# Patient Record
Sex: Female | Born: 1955 | Race: White | Hispanic: No | Marital: Married | Smoking: Never smoker
Health system: Southern US, Community
[De-identification: ages and names within clinical notes are randomized; demographics above are authoritative.]

## PROBLEM LIST (undated history)

## (undated) DIAGNOSIS — I712 Thoracic aortic aneurysm, without rupture, unspecified: Secondary | ICD-10-CM

## (undated) DIAGNOSIS — E785 Hyperlipidemia, unspecified: Secondary | ICD-10-CM

## (undated) HISTORY — PX: OTHER SURGICAL HISTORY: SHX169

---

## 2016-03-17 ENCOUNTER — Other Ambulatory Visit: Payer: Self-pay | Admitting: Family Medicine

## 2016-03-17 DIAGNOSIS — I712 Thoracic aortic aneurysm, without rupture, unspecified: Secondary | ICD-10-CM

## 2016-03-17 DIAGNOSIS — G43909 Migraine, unspecified, not intractable, without status migrainosus: Secondary | ICD-10-CM | POA: Diagnosis not present

## 2016-03-17 DIAGNOSIS — M859 Disorder of bone density and structure, unspecified: Secondary | ICD-10-CM | POA: Diagnosis not present

## 2016-03-17 DIAGNOSIS — E7800 Pure hypercholesterolemia, unspecified: Secondary | ICD-10-CM | POA: Diagnosis not present

## 2016-03-17 DIAGNOSIS — Z209 Contact with and (suspected) exposure to unspecified communicable disease: Secondary | ICD-10-CM | POA: Diagnosis not present

## 2016-03-17 DIAGNOSIS — Z0000 Encounter for general adult medical examination without abnormal findings: Secondary | ICD-10-CM | POA: Diagnosis not present

## 2016-03-17 DIAGNOSIS — Z79899 Other long term (current) drug therapy: Secondary | ICD-10-CM | POA: Diagnosis not present

## 2016-03-23 ENCOUNTER — Ambulatory Visit
Admission: RE | Admit: 2016-03-23 | Discharge: 2016-03-23 | Disposition: A | Discharge status: Home / Self Care | Payer: Self-pay | Source: Ambulatory Visit | Attending: Family Medicine | Admitting: Family Medicine

## 2016-03-23 DIAGNOSIS — I712 Thoracic aortic aneurysm, without rupture, unspecified: Secondary | ICD-10-CM

## 2016-03-23 MED ORDER — IOPAMIDOL (ISOVUE-370) INJECTION 76%
75.0000 mL | Freq: Once | INTRAVENOUS | Status: AC | PRN
  Administered 2016-03-23: 75 mL via INTRAVENOUS

## 2016-06-30 DIAGNOSIS — K080 Exfoliation of teeth due to systemic causes: Secondary | ICD-10-CM | POA: Diagnosis not present

## 2016-07-21 ENCOUNTER — Other Ambulatory Visit: Payer: Self-pay | Admitting: Family Medicine

## 2016-07-21 DIAGNOSIS — I712 Thoracic aortic aneurysm, without rupture, unspecified: Secondary | ICD-10-CM

## 2016-07-21 DIAGNOSIS — M79604 Pain in right leg: Secondary | ICD-10-CM | POA: Diagnosis not present

## 2016-07-21 DIAGNOSIS — E7800 Pure hypercholesterolemia, unspecified: Secondary | ICD-10-CM | POA: Diagnosis not present

## 2016-07-21 DIAGNOSIS — G43909 Migraine, unspecified, not intractable, without status migrainosus: Secondary | ICD-10-CM | POA: Diagnosis not present

## 2016-09-13 DIAGNOSIS — Z6825 Body mass index (BMI) 25.0-25.9, adult: Secondary | ICD-10-CM | POA: Diagnosis not present

## 2016-09-13 DIAGNOSIS — Z1231 Encounter for screening mammogram for malignant neoplasm of breast: Secondary | ICD-10-CM | POA: Diagnosis not present

## 2016-09-13 DIAGNOSIS — R5383 Other fatigue: Secondary | ICD-10-CM | POA: Diagnosis not present

## 2016-09-13 DIAGNOSIS — Z130 Encounter for screening for diseases of the blood and blood-forming organs and certain disorders involving the immune mechanism: Secondary | ICD-10-CM | POA: Diagnosis not present

## 2016-09-13 DIAGNOSIS — Z1389 Encounter for screening for other disorder: Secondary | ICD-10-CM | POA: Diagnosis not present

## 2016-09-13 DIAGNOSIS — Z01419 Encounter for gynecological examination (general) (routine) without abnormal findings: Secondary | ICD-10-CM | POA: Diagnosis not present

## 2016-09-15 ENCOUNTER — Other Ambulatory Visit: Payer: Self-pay | Admitting: Obstetrics and Gynecology

## 2016-09-15 DIAGNOSIS — R928 Other abnormal and inconclusive findings on diagnostic imaging of breast: Secondary | ICD-10-CM

## 2016-09-17 ENCOUNTER — Ambulatory Visit
Admission: RE | Admit: 2016-09-17 | Discharge: 2016-09-17 | Disposition: A | Discharge status: Home / Self Care | Payer: Federal, State, Local not specified - PPO | Source: Ambulatory Visit | Attending: Obstetrics and Gynecology | Admitting: Obstetrics and Gynecology

## 2016-09-17 DIAGNOSIS — R928 Other abnormal and inconclusive findings on diagnostic imaging of breast: Secondary | ICD-10-CM

## 2016-09-17 DIAGNOSIS — N6313 Unspecified lump in the right breast, lower outer quadrant: Secondary | ICD-10-CM | POA: Diagnosis not present

## 2016-09-29 ENCOUNTER — Ambulatory Visit
Admission: RE | Admit: 2016-09-29 | Discharge: 2016-09-29 | Disposition: A | Discharge status: Home / Self Care | Payer: Federal, State, Local not specified - PPO | Source: Ambulatory Visit | Attending: Family Medicine | Admitting: Family Medicine

## 2016-09-29 DIAGNOSIS — I712 Thoracic aortic aneurysm, without rupture, unspecified: Secondary | ICD-10-CM

## 2016-09-29 MED ORDER — IOPAMIDOL (ISOVUE-370) INJECTION 76%
75.0000 mL | Freq: Once | INTRAVENOUS | Status: AC | PRN
  Administered 2016-09-29: 75 mL via INTRAVENOUS

## 2016-10-06 DIAGNOSIS — Q181 Preauricular sinus and cyst: Secondary | ICD-10-CM | POA: Diagnosis not present

## 2016-10-19 DIAGNOSIS — L723 Sebaceous cyst: Secondary | ICD-10-CM | POA: Diagnosis not present

## 2016-10-19 DIAGNOSIS — L989 Disorder of the skin and subcutaneous tissue, unspecified: Secondary | ICD-10-CM | POA: Diagnosis not present

## 2016-11-30 DIAGNOSIS — L989 Disorder of the skin and subcutaneous tissue, unspecified: Secondary | ICD-10-CM | POA: Diagnosis not present

## 2016-11-30 DIAGNOSIS — D210 Benign neoplasm of connective and other soft tissue of head, face and neck: Secondary | ICD-10-CM | POA: Diagnosis not present

## 2016-12-20 DIAGNOSIS — H938X2 Other specified disorders of left ear: Secondary | ICD-10-CM | POA: Diagnosis not present

## 2016-12-20 DIAGNOSIS — M724 Pseudosarcomatous fibromatosis: Secondary | ICD-10-CM | POA: Diagnosis not present

## 2017-01-05 DIAGNOSIS — K080 Exfoliation of teeth due to systemic causes: Secondary | ICD-10-CM | POA: Diagnosis not present

## 2017-02-03 IMAGING — CT CT ANGIO CHEST
2 of 5 series · 8 of 30 positions shown · IV contrast (75CC ISOVUE 370)
Comparison: None available; prior exam was in [place] in 4204.

CLINICAL DATA: Thoracic aortic aneurysm without rupture, followup

EXAM:
CT ANGIOGRAPHY CHEST WITH CONTRAST
TECHNIQUE: Multidetector CT imaging of the chest was performed using the
standard protocol during bolus administration of intravenous
contrast. Multiplanar CT image reconstructions and MIPs were
obtained to evaluate the vascular anatomy.
CONTRAST:  75 cc Isovue 370 IV

[Series 4: angio · axial · 0.70mm/px · z∈[-205,-55]mm · 3 of 120 slices shown]
[im 30/120  lung]
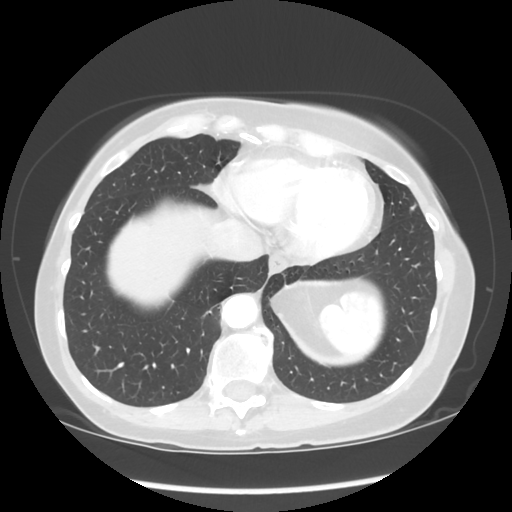
[im 60/120  mediastinal]
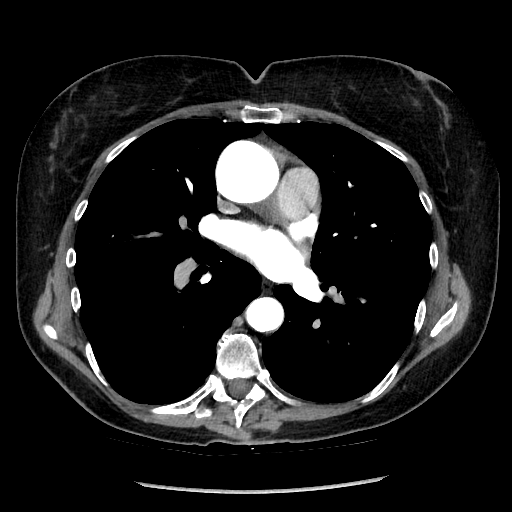
[im 90/120  lung]
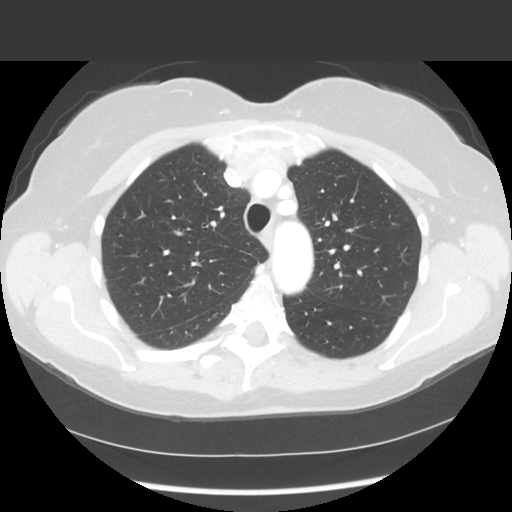

[Series 602: sagittal body · sagittal · 0.70mm/px · 5 of 145 slices shown]
[im 25/145  lung]
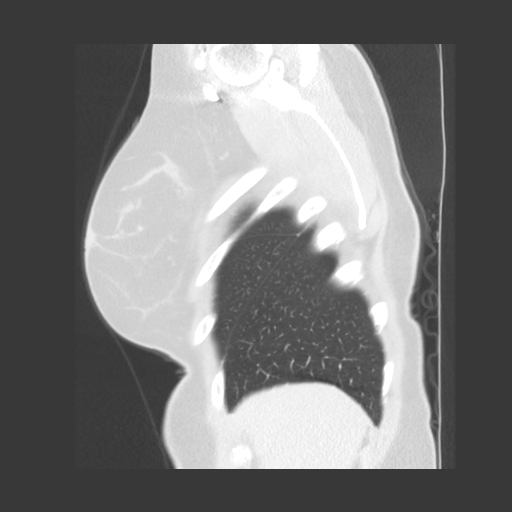
[im 49/145  lung]
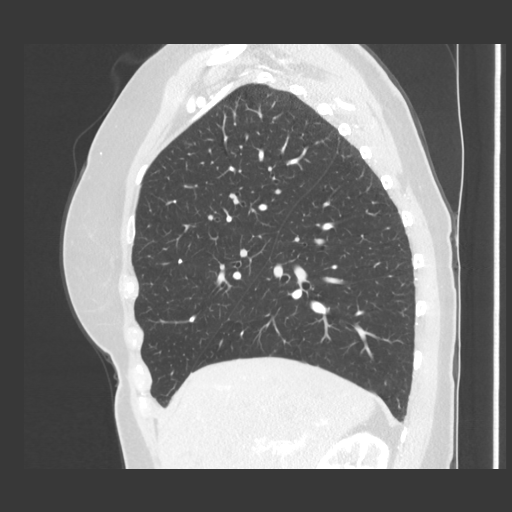
[im 73/145  lung]
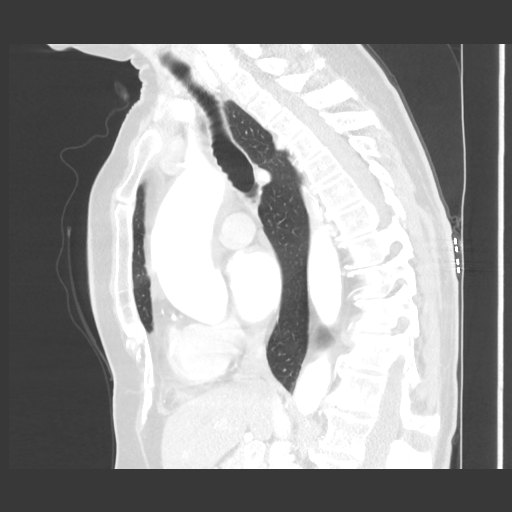
[im 97/145  lung]
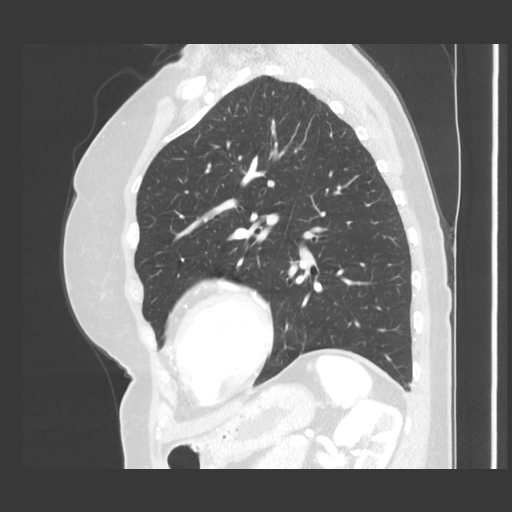
[im 121/145  lung]
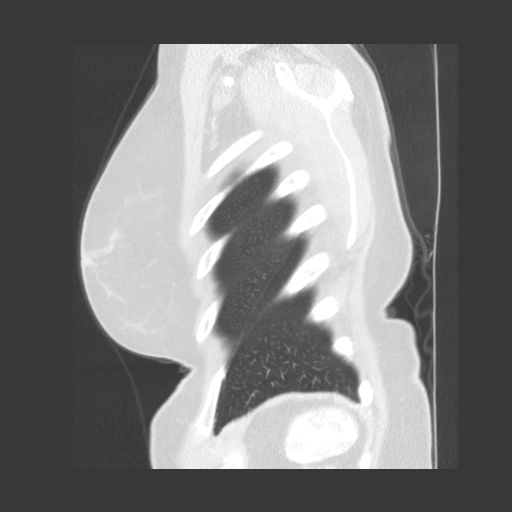

[8 of 30 positions shown; findings below may reference images not displayed]

FINDINGS: Minimal atherosclerotic calcification aorta.

Aneurysmal dilatation ascending thoracic aorta, 4.6 cm transverse at
the level of the RIGHT pulmonary artery and 4.2 cm transverse at
aortic root.

Mild LEFT ventricular dilatation.

Pulmonary arteries grossly unremarkable for aortic technique.

No thoracic adenopathy.

Multiple hepatic cysts.

Remaining visualized upper abdomen unremarkable.

Minimal atelectasis or scarring at medial RIGHT lower lobe.

Small linear scar posterior RIGHT apex image 17.

Lungs otherwise clear.

No infiltrate, pleural effusion or pneumothorax.

No acute osseous findings.

Review of the MIP images confirms the above findings.
IMPRESSION: Aneurysmal dilatation ascending thoracic aorta to a maximal though
4.6 cm transverse at the level of the RIGHT pulmonary artery.

Associated LEFT ventricular dilatation.

Remainder of exam shows no significant abnormalities.

## 2017-03-07 DIAGNOSIS — J302 Other seasonal allergic rhinitis: Secondary | ICD-10-CM | POA: Diagnosis not present

## 2017-03-07 DIAGNOSIS — R432 Parageusia: Secondary | ICD-10-CM | POA: Diagnosis not present

## 2017-03-07 DIAGNOSIS — E7800 Pure hypercholesterolemia, unspecified: Secondary | ICD-10-CM | POA: Diagnosis not present

## 2017-03-07 DIAGNOSIS — R8290 Unspecified abnormal findings in urine: Secondary | ICD-10-CM | POA: Diagnosis not present

## 2017-03-23 DIAGNOSIS — E7800 Pure hypercholesterolemia, unspecified: Secondary | ICD-10-CM | POA: Diagnosis not present

## 2017-03-23 DIAGNOSIS — G43909 Migraine, unspecified, not intractable, without status migrainosus: Secondary | ICD-10-CM | POA: Diagnosis not present

## 2017-03-23 DIAGNOSIS — D72819 Decreased white blood cell count, unspecified: Secondary | ICD-10-CM | POA: Diagnosis not present

## 2017-03-23 DIAGNOSIS — I712 Thoracic aortic aneurysm, without rupture: Secondary | ICD-10-CM | POA: Diagnosis not present

## 2017-03-23 DIAGNOSIS — M85852 Other specified disorders of bone density and structure, left thigh: Secondary | ICD-10-CM | POA: Diagnosis not present

## 2017-03-23 DIAGNOSIS — M859 Disorder of bone density and structure, unspecified: Secondary | ICD-10-CM | POA: Diagnosis not present

## 2017-03-23 DIAGNOSIS — Z0000 Encounter for general adult medical examination without abnormal findings: Secondary | ICD-10-CM | POA: Diagnosis not present

## 2017-07-26 DIAGNOSIS — K080 Exfoliation of teeth due to systemic causes: Secondary | ICD-10-CM | POA: Diagnosis not present

## 2017-09-15 DIAGNOSIS — N951 Menopausal and female climacteric states: Secondary | ICD-10-CM | POA: Diagnosis not present

## 2017-09-15 DIAGNOSIS — Z130 Encounter for screening for diseases of the blood and blood-forming organs and certain disorders involving the immune mechanism: Secondary | ICD-10-CM | POA: Diagnosis not present

## 2017-09-15 DIAGNOSIS — R319 Hematuria, unspecified: Secondary | ICD-10-CM | POA: Diagnosis not present

## 2017-09-15 DIAGNOSIS — Z1389 Encounter for screening for other disorder: Secondary | ICD-10-CM | POA: Diagnosis not present

## 2017-09-15 DIAGNOSIS — Z01419 Encounter for gynecological examination (general) (routine) without abnormal findings: Secondary | ICD-10-CM | POA: Diagnosis not present

## 2017-09-15 DIAGNOSIS — Z1231 Encounter for screening mammogram for malignant neoplasm of breast: Secondary | ICD-10-CM | POA: Diagnosis not present

## 2017-09-15 DIAGNOSIS — Z6824 Body mass index (BMI) 24.0-24.9, adult: Secondary | ICD-10-CM | POA: Diagnosis not present

## 2017-09-26 DIAGNOSIS — J018 Other acute sinusitis: Secondary | ICD-10-CM | POA: Diagnosis not present

## 2017-09-26 DIAGNOSIS — R0602 Shortness of breath: Secondary | ICD-10-CM | POA: Diagnosis not present

## 2017-12-20 DIAGNOSIS — M79604 Pain in right leg: Secondary | ICD-10-CM | POA: Diagnosis not present

## 2017-12-20 DIAGNOSIS — R432 Parageusia: Secondary | ICD-10-CM | POA: Diagnosis not present

## 2017-12-20 DIAGNOSIS — E7800 Pure hypercholesterolemia, unspecified: Secondary | ICD-10-CM | POA: Diagnosis not present

## 2017-12-20 DIAGNOSIS — R05 Cough: Secondary | ICD-10-CM | POA: Diagnosis not present

## 2017-12-21 DIAGNOSIS — J018 Other acute sinusitis: Secondary | ICD-10-CM | POA: Diagnosis not present

## 2017-12-21 DIAGNOSIS — G43909 Migraine, unspecified, not intractable, without status migrainosus: Secondary | ICD-10-CM | POA: Diagnosis not present

## 2018-01-04 DIAGNOSIS — J342 Deviated nasal septum: Secondary | ICD-10-CM | POA: Diagnosis not present

## 2018-01-04 DIAGNOSIS — J322 Chronic ethmoidal sinusitis: Secondary | ICD-10-CM | POA: Diagnosis not present

## 2018-01-04 DIAGNOSIS — B370 Candidal stomatitis: Secondary | ICD-10-CM | POA: Diagnosis not present

## 2018-01-04 DIAGNOSIS — J320 Chronic maxillary sinusitis: Secondary | ICD-10-CM | POA: Diagnosis not present

## 2018-01-17 DIAGNOSIS — R432 Parageusia: Secondary | ICD-10-CM | POA: Diagnosis not present

## 2018-01-17 DIAGNOSIS — J342 Deviated nasal septum: Secondary | ICD-10-CM | POA: Diagnosis not present

## 2018-01-17 DIAGNOSIS — J322 Chronic ethmoidal sinusitis: Secondary | ICD-10-CM | POA: Diagnosis not present

## 2018-01-17 DIAGNOSIS — J320 Chronic maxillary sinusitis: Secondary | ICD-10-CM | POA: Diagnosis not present

## 2018-02-01 DIAGNOSIS — K080 Exfoliation of teeth due to systemic causes: Secondary | ICD-10-CM | POA: Diagnosis not present

## 2018-05-12 DIAGNOSIS — I8390 Asymptomatic varicose veins of unspecified lower extremity: Secondary | ICD-10-CM | POA: Diagnosis not present

## 2018-06-21 DIAGNOSIS — E7800 Pure hypercholesterolemia, unspecified: Secondary | ICD-10-CM | POA: Diagnosis not present

## 2018-06-21 DIAGNOSIS — M791 Myalgia, unspecified site: Secondary | ICD-10-CM | POA: Diagnosis not present

## 2018-06-21 DIAGNOSIS — G43909 Migraine, unspecified, not intractable, without status migrainosus: Secondary | ICD-10-CM | POA: Diagnosis not present

## 2018-06-21 DIAGNOSIS — M859 Disorder of bone density and structure, unspecified: Secondary | ICD-10-CM | POA: Diagnosis not present

## 2018-06-21 DIAGNOSIS — I712 Thoracic aortic aneurysm, without rupture: Secondary | ICD-10-CM | POA: Diagnosis not present

## 2018-08-09 DIAGNOSIS — K080 Exfoliation of teeth due to systemic causes: Secondary | ICD-10-CM | POA: Diagnosis not present

## 2018-08-15 ENCOUNTER — Other Ambulatory Visit: Payer: Self-pay | Admitting: Family Medicine

## 2018-08-15 DIAGNOSIS — I712 Thoracic aortic aneurysm, without rupture, unspecified: Secondary | ICD-10-CM

## 2018-08-22 ENCOUNTER — Ambulatory Visit
Admission: RE | Admit: 2018-08-22 | Discharge: 2018-08-22 | Disposition: A | Discharge status: Home / Self Care | Payer: Federal, State, Local not specified - PPO | Source: Ambulatory Visit | Attending: Family Medicine | Admitting: Family Medicine

## 2018-08-22 DIAGNOSIS — I712 Thoracic aortic aneurysm, without rupture, unspecified: Secondary | ICD-10-CM

## 2018-08-22 MED ORDER — IOPAMIDOL (ISOVUE-370) INJECTION 76%
75.0000 mL | Freq: Once | INTRAVENOUS | Status: AC | PRN
  Administered 2018-08-22: 75 mL via INTRAVENOUS

## 2018-09-18 DIAGNOSIS — Z1231 Encounter for screening mammogram for malignant neoplasm of breast: Secondary | ICD-10-CM | POA: Diagnosis not present

## 2018-11-27 DIAGNOSIS — K080 Exfoliation of teeth due to systemic causes: Secondary | ICD-10-CM | POA: Diagnosis not present

## 2019-01-04 DIAGNOSIS — K080 Exfoliation of teeth due to systemic causes: Secondary | ICD-10-CM | POA: Diagnosis not present

## 2019-02-26 DIAGNOSIS — N3000 Acute cystitis without hematuria: Secondary | ICD-10-CM | POA: Diagnosis not present

## 2019-06-22 DIAGNOSIS — E7800 Pure hypercholesterolemia, unspecified: Secondary | ICD-10-CM | POA: Diagnosis not present

## 2019-06-22 DIAGNOSIS — M859 Disorder of bone density and structure, unspecified: Secondary | ICD-10-CM | POA: Diagnosis not present

## 2019-06-22 DIAGNOSIS — E559 Vitamin D deficiency, unspecified: Secondary | ICD-10-CM | POA: Diagnosis not present

## 2019-06-22 DIAGNOSIS — G43909 Migraine, unspecified, not intractable, without status migrainosus: Secondary | ICD-10-CM | POA: Diagnosis not present

## 2019-07-10 ENCOUNTER — Other Ambulatory Visit: Payer: Self-pay | Admitting: Family Medicine

## 2019-07-10 DIAGNOSIS — I712 Thoracic aortic aneurysm, without rupture, unspecified: Secondary | ICD-10-CM

## 2019-08-01 DIAGNOSIS — E7800 Pure hypercholesterolemia, unspecified: Secondary | ICD-10-CM | POA: Diagnosis not present

## 2019-08-01 DIAGNOSIS — E559 Vitamin D deficiency, unspecified: Secondary | ICD-10-CM | POA: Diagnosis not present

## 2019-08-23 ENCOUNTER — Ambulatory Visit
Admission: RE | Admit: 2019-08-23 | Discharge: 2019-08-23 | Disposition: A | Discharge status: Home / Self Care | Payer: Federal, State, Local not specified - PPO | Source: Ambulatory Visit | Attending: Family Medicine | Admitting: Family Medicine

## 2019-08-23 ENCOUNTER — Other Ambulatory Visit: Payer: Self-pay

## 2019-08-23 DIAGNOSIS — I712 Thoracic aortic aneurysm, without rupture, unspecified: Secondary | ICD-10-CM

## 2019-08-23 DIAGNOSIS — J9811 Atelectasis: Secondary | ICD-10-CM | POA: Diagnosis not present

## 2019-08-23 MED ORDER — GADOBENATE DIMEGLUMINE 529 MG/ML IV SOLN
13.0000 mL | Freq: Once | INTRAVENOUS | Status: AC | PRN
  Administered 2019-08-23: 09:00:00 13 mL via INTRAVENOUS

## 2020-02-06 DIAGNOSIS — Z130 Encounter for screening for diseases of the blood and blood-forming organs and certain disorders involving the immune mechanism: Secondary | ICD-10-CM | POA: Diagnosis not present

## 2020-02-06 DIAGNOSIS — R8290 Unspecified abnormal findings in urine: Secondary | ICD-10-CM | POA: Diagnosis not present

## 2020-02-06 DIAGNOSIS — Z124 Encounter for screening for malignant neoplasm of cervix: Secondary | ICD-10-CM | POA: Diagnosis not present

## 2020-02-06 DIAGNOSIS — Z6825 Body mass index (BMI) 25.0-25.9, adult: Secondary | ICD-10-CM | POA: Diagnosis not present

## 2020-02-06 DIAGNOSIS — Z1151 Encounter for screening for human papillomavirus (HPV): Secondary | ICD-10-CM | POA: Diagnosis not present

## 2020-02-06 DIAGNOSIS — Z1231 Encounter for screening mammogram for malignant neoplasm of breast: Secondary | ICD-10-CM | POA: Diagnosis not present

## 2020-02-06 DIAGNOSIS — Z01419 Encounter for gynecological examination (general) (routine) without abnormal findings: Secondary | ICD-10-CM | POA: Diagnosis not present

## 2020-05-13 DIAGNOSIS — R1013 Epigastric pain: Secondary | ICD-10-CM | POA: Diagnosis not present

## 2020-05-14 ENCOUNTER — Other Ambulatory Visit (HOSPITAL_BASED_OUTPATIENT_CLINIC_OR_DEPARTMENT_OTHER): Payer: Self-pay | Admitting: Family Medicine

## 2020-05-14 DIAGNOSIS — R1013 Epigastric pain: Secondary | ICD-10-CM | POA: Diagnosis not present

## 2020-05-23 ENCOUNTER — Ambulatory Visit (HOSPITAL_BASED_OUTPATIENT_CLINIC_OR_DEPARTMENT_OTHER)
Admission: RE | Admit: 2020-05-23 | Discharge: 2020-05-23 | Disposition: A | Discharge status: Home / Self Care | Payer: Federal, State, Local not specified - PPO | Source: Ambulatory Visit | Attending: Family Medicine | Admitting: Family Medicine

## 2020-05-23 ENCOUNTER — Other Ambulatory Visit: Payer: Self-pay

## 2020-05-23 DIAGNOSIS — R1013 Epigastric pain: Secondary | ICD-10-CM | POA: Diagnosis not present

## 2020-12-29 ENCOUNTER — Other Ambulatory Visit: Payer: Self-pay | Admitting: Family Medicine

## 2020-12-29 DIAGNOSIS — I712 Thoracic aortic aneurysm, without rupture, unspecified: Secondary | ICD-10-CM

## 2020-12-29 DIAGNOSIS — I7121 Aneurysm of the ascending aorta, without rupture: Secondary | ICD-10-CM

## 2021-06-22 ENCOUNTER — Other Ambulatory Visit: Payer: Self-pay

## 2021-06-22 ENCOUNTER — Ambulatory Visit
Admission: RE | Admit: 2021-06-22 | Discharge: 2021-06-22 | Disposition: A | Discharge status: Home / Self Care | Payer: Medicare Other | Source: Ambulatory Visit | Attending: Family Medicine | Admitting: Family Medicine

## 2021-06-22 DIAGNOSIS — I712 Thoracic aortic aneurysm, without rupture, unspecified: Secondary | ICD-10-CM

## 2021-06-22 DIAGNOSIS — I7121 Aneurysm of the ascending aorta, without rupture: Secondary | ICD-10-CM

## 2021-06-22 MED ORDER — GADOBENATE DIMEGLUMINE 529 MG/ML IV SOLN
14.0000 mL | Freq: Once | INTRAVENOUS | Status: AC | PRN
  Administered 2021-06-22: 14 mL via INTRAVENOUS

## 2021-06-23 ENCOUNTER — Other Ambulatory Visit: Payer: Federal, State, Local not specified - PPO

## 2022-04-29 ENCOUNTER — Encounter: Payer: Self-pay | Admitting: Plastic Surgery

## 2022-04-29 ENCOUNTER — Ambulatory Visit (INDEPENDENT_AMBULATORY_CARE_PROVIDER_SITE_OTHER): Payer: Self-pay | Admitting: Plastic Surgery

## 2022-04-29 DIAGNOSIS — Z719 Counseling, unspecified: Secondary | ICD-10-CM

## 2022-04-29 NOTE — Progress Notes (Signed)
Botulinum Toxin Procedure Note  Procedure: Cosmetic botulinum toxin   Pre-operative Diagnosis: Dynamic rhytides   Post-operative Diagnosis: Same  Complications:  None  Brief history: The patient desires botulinum toxin injection of her forehead. I discussed with the patient this proposed procedure of botulinum toxin injections, which is customized depending on the particular needs of the patient. It is performed on facial rhytids as a temporary correction. The alternatives were discussed with the patient. The risks were addressed including bleeding, scarring, infection, damage to deeper structures, asymmetry, and chronic pain, which may occur infrequently after a procedure. The individual's choice to undergo a surgical procedure is based on the comparison of risks to potential benefits. Other risks include unsatisfactory results, brow ptosis, eyelid ptosis, allergic reaction, temporary paralysis, which should go away with time, bruising, blurring disturbances and delayed healing. Botulinum toxin injections do not arrest the aging process or produce permanent tightening of the eyelid.  Operative intervention maybe necessary to maintain the results of a blepharoplasty or botulinum toxin. The patient understands and wishes to proceed.  Procedure: The area was prepped with alcohol and dried with a clean gauze. Using a clean technique, the botulinum toxin was diluted with 1.25 cc of preservative-free normal saline which was slowly injected with an 18 gauge needle in a tuberculin syringes.  A 32 gauge needles were then used to inject the botulinum toxin. This mixture allow for an aliquot of 4 units per 0.1 cc in each injection site.    Subsequently the mixture was injected in the glabellar and forehead area with preservation of the temporal branch to the lateral eyebrow as well as into each lateral canthal area beginning from the lateral orbital rim medial to the zygomaticus major in 3 separate areas. A  total of 30 Units of botulinum toxin was used. The forehead and glabellar area was injected with care to inject intramuscular only while holding pressure on the supratrochlear vessels in each area during each injection on either side of the medial corrugators. The injection proceeded vertically superiorly to the medial 2/3 of the frontalis muscle and superior 2/3 of the lateral frontalis, again with preservation of the frontal branch. No complications were noted. Light pressure was held for 5 minutes. She was instructed explicitly in post-operative care.  Botox LOT:  [id number]  EXP:  2025/5  

## 2022-05-07 ENCOUNTER — Encounter: Payer: Self-pay | Admitting: Plastic Surgery

## 2022-05-07 ENCOUNTER — Ambulatory Visit (INDEPENDENT_AMBULATORY_CARE_PROVIDER_SITE_OTHER): Payer: Self-pay | Admitting: Plastic Surgery

## 2022-05-07 DIAGNOSIS — Z719 Counseling, unspecified: Secondary | ICD-10-CM

## 2022-05-07 NOTE — Progress Notes (Signed)
Preoperative Dx: hyperpigmentation of face  Postoperative Dx:  same  Procedure: laser to face   Anesthesia: none  Description of Procedure:  Risks and complications were explained to the patient. Consent was confirmed and signed. Eye protection was placed. Time out was called and all information was confirmed to be correct. The area  area was prepped with alcohol and wiped dry. The BBL laser was set at 590 and 515 at 6-7 J/cm2. The fae was lasered. The patient tolerated the procedure well and there were no complications. The patient is to follow up in 4 weeks.

## 2022-05-18 ENCOUNTER — Other Ambulatory Visit: Payer: Self-pay | Admitting: Family Medicine

## 2022-05-18 DIAGNOSIS — I7121 Aneurysm of the ascending aorta, without rupture: Secondary | ICD-10-CM

## 2022-05-26 NOTE — Progress Notes (Signed)
Preoperative Dx: hyperpigmentation to face  Postoperative Dx:  same  Procedure: laser to face   Anesthesia: none  Description of Procedure:  Risks and complications were explained to the patient. Consent was confirmed and signed. Time out was called and all information was confirmed to be correct. The area  area was prepped with alcohol and wiped dry. The BBL  laser was set at 590 and 515 at 5-6 J/cm2. The face was lasered.   The patient tolerated the procedure well and there were no complications. I discussed postprocedure care with the patient.The patient is to follow up in 4 weeks.

## 2022-05-28 ENCOUNTER — Ambulatory Visit (INDEPENDENT_AMBULATORY_CARE_PROVIDER_SITE_OTHER): Payer: Self-pay | Admitting: Student

## 2022-05-28 DIAGNOSIS — Z719 Counseling, unspecified: Secondary | ICD-10-CM

## 2022-06-18 ENCOUNTER — Ambulatory Visit (INDEPENDENT_AMBULATORY_CARE_PROVIDER_SITE_OTHER): Payer: Self-pay | Admitting: Student

## 2022-06-18 DIAGNOSIS — Z719 Counseling, unspecified: Secondary | ICD-10-CM

## 2022-06-18 NOTE — Progress Notes (Signed)
Preoperative Dx: Hyperpigmentation to the face  Postoperative Dx:  same  Procedure: laser to face  Anesthesia: none  Patient states she has a pigmented lesion on the left nasal sidewall that she would like treated.  She states she has had this evaluated by dermatology and biopsied, she states that is noncancerous.  Description of Procedure:  Risks and complications were explained to the patient. Consent was confirmed and signed. Time out was called and all information was confirmed to be correct. The area  area was prepped with alcohol and wiped dry. The BBL laser was set at 590 nm and 515 nm at 5 J/cm2. The the face was lasered.  The BBL laser was set at 515 nm at 12 J/cm with the 7 mm handpiece and one shot was completed to the lesion to the left nasal sidewall.   The patient tolerated the procedure well and there were no complications. The patient is to follow up in 4 weeks.

## 2022-07-13 ENCOUNTER — Ambulatory Visit (INDEPENDENT_AMBULATORY_CARE_PROVIDER_SITE_OTHER): Payer: Self-pay | Admitting: Student

## 2022-07-13 ENCOUNTER — Encounter: Payer: Self-pay | Admitting: Student

## 2022-07-13 DIAGNOSIS — Z719 Counseling, unspecified: Secondary | ICD-10-CM

## 2022-07-13 NOTE — Progress Notes (Signed)
Preoperative Dx: Hyperpigmentation  Postoperative Dx:  same  Procedure: laser to face  Anesthesia: none  Description of Procedure:  Risks and complications were explained to the patient. Consent was confirmed and signed. Time out was called and all information was confirmed to be correct. The area  area was prepped with alcohol and wiped dry. The BBL laser was set at 590 nm at 5 J/cm and then at 515 nm at 12 J/cm for pigmented lesions. The face was lasered. The patient tolerated the procedure well and there were no complications.   Patient to follow-up in a few weeks with Dr. Ulice Bold regarding Botox.  At that time, patient will let us know if she feels she needs more BBL treatments.  Pictures were obtained of the patient and placed in the chart with the patient's or guardian's permission.   Dr. Ulice Bold also examined the patient.

## 2022-07-23 ENCOUNTER — Ambulatory Visit: Payer: Self-pay | Admitting: Plastic Surgery

## 2022-07-23 ENCOUNTER — Encounter: Payer: Self-pay | Admitting: Plastic Surgery

## 2022-07-23 DIAGNOSIS — Z719 Counseling, unspecified: Secondary | ICD-10-CM

## 2022-07-23 NOTE — Progress Notes (Signed)
Botulinum Toxin Procedure Note  Procedure: Cosmetic botulinum toxin   Pre-operative Diagnosis: Dynamic rhytides   Post-operative Diagnosis: Same  Complications:  None  Brief history: The patient desires botulinum toxin injection of her forehead. I discussed with the patient this proposed procedure of botulinum toxin injections, which is customized depending on the particular needs of the patient. It is performed on facial rhytids as a temporary correction. The alternatives were discussed with the patient. The risks were addressed including bleeding, scarring, infection, damage to deeper structures, asymmetry, and chronic pain, which may occur infrequently after a procedure. The individual's choice to undergo a surgical procedure is based on the comparison of risks to potential benefits. Other risks include unsatisfactory results, brow ptosis, eyelid ptosis, allergic reaction, temporary paralysis, which should go away with time, bruising, blurring disturbances and delayed healing. Botulinum toxin injections do not arrest the aging process or produce permanent tightening of the eyelid.  Operative intervention maybe necessary to maintain the results of a blepharoplasty or botulinum toxin. The patient understands and wishes to proceed.  Procedure: The area was prepped with alcohol and dried with a clean gauze. Using a clean technique, the botulinum toxin was diluted with 1.25 cc of preservative-free normal saline which was slowly injected with an 18 gauge needle in a tuberculin syringes.  A 32 gauge needles were then used to inject the botulinum toxin. This mixture allow for an aliquot of 4 units per 0.1 cc in each injection site.    Subsequently the mixture was injected in the glabellar and forehead area with preservation of the temporal branch to the lateral eyebrow as well as into each lateral canthal area beginning from the lateral orbital rim medial to the zygomaticus major in 3 separate areas. A  total of 30 Units of botulinum toxin was used. The forehead and glabellar area was injected with care to inject intramuscular only while holding pressure on the supratrochlear vessels in each area during each injection on either side of the medial corrugators. The injection proceeded vertically superiorly to the medial 2/3 of the frontalis muscle and superior 2/3 of the lateral frontalis, again with preservation of the frontal branch. No complications were noted. Light pressure was held for 5 minutes. She was instructed explicitly in post-operative care.  Botox LOT:  [id number]  EXP:  25/9  

## 2022-08-24 ENCOUNTER — Ambulatory Visit: Payer: Medicare Other | Admitting: Physician Assistant

## 2022-08-27 ENCOUNTER — Ambulatory Visit
Admission: RE | Admit: 2022-08-27 | Discharge: 2022-08-27 | Disposition: A | Discharge status: Home / Self Care | Payer: Federal, State, Local not specified - PPO | Source: Ambulatory Visit | Attending: Family Medicine | Admitting: Family Medicine

## 2022-08-27 DIAGNOSIS — I7121 Aneurysm of the ascending aorta, without rupture: Secondary | ICD-10-CM

## 2022-08-27 MED ORDER — GADOBENATE DIMEGLUMINE 529 MG/ML IV SOLN
13.0000 mL | Freq: Once | INTRAVENOUS | Status: AC | PRN
  Administered 2022-08-27: 13 mL via INTRAVENOUS

## 2022-10-28 ENCOUNTER — Other Ambulatory Visit: Payer: Self-pay | Admitting: Family Medicine

## 2022-10-28 DIAGNOSIS — Z1231 Encounter for screening mammogram for malignant neoplasm of breast: Secondary | ICD-10-CM

## 2022-10-28 DIAGNOSIS — E2839 Other primary ovarian failure: Secondary | ICD-10-CM

## 2022-10-29 ENCOUNTER — Ambulatory Visit (INDEPENDENT_AMBULATORY_CARE_PROVIDER_SITE_OTHER): Payer: Self-pay | Admitting: Plastic Surgery

## 2022-10-29 ENCOUNTER — Encounter: Payer: Self-pay | Admitting: Plastic Surgery

## 2022-10-29 VITALS — BP 120/81 | HR 72

## 2022-10-29 DIAGNOSIS — Z719 Counseling, unspecified: Secondary | ICD-10-CM

## 2022-10-29 NOTE — Progress Notes (Signed)
Botulinum Toxin Procedure Note  Procedure: Cosmetic botulinum toxin   Pre-operative Diagnosis: Dynamic rhytides   Post-operative Diagnosis: Same  Complications:  None  Brief history: The patient desires botulinum toxin injection of her forehead. I discussed with the patient this proposed procedure of botulinum toxin injections, which is customized depending on the particular needs of the patient. It is performed on facial rhytids as a temporary correction. The alternatives were discussed with the patient. The risks were addressed including bleeding, scarring, infection, damage to deeper structures, asymmetry, and chronic pain, which may occur infrequently after a procedure. The individual's choice to undergo a surgical procedure is based on the comparison of risks to potential benefits. Other risks include unsatisfactory results, brow ptosis, eyelid ptosis, allergic reaction, temporary paralysis, which should go away with time, bruising, blurring disturbances and delayed healing. Botulinum toxin injections do not arrest the aging process or produce permanent tightening of the eyelid.  Operative intervention maybe necessary to maintain the results of a blepharoplasty or botulinum toxin. The patient understands and wishes to proceed.  Procedure: The area was prepped with alcohol and dried with a clean gauze. Using a clean technique, the botulinum toxin was diluted with 1.25 cc of preservative-free normal saline which was slowly injected with an 18 gauge needle in a tuberculin syringes.  A 32 gauge needles were then used to inject the botulinum toxin. This mixture allow for an aliquot of 4 units per 0.1 cc in each injection site.    Subsequently the mixture was injected in the glabellar and forehead area with preservation of the temporal branch to the lateral eyebrow as well as into each lateral canthal area beginning from the lateral orbital rim medial to the zygomaticus major in 3 separate areas. A  total of 30 Units of botulinum toxin was used. The forehead and glabellar area was injected with care to inject intramuscular only while holding pressure on the supratrochlear vessels in each area during each injection on either side of the medial corrugators. The injection proceeded vertically superiorly to the medial 2/3 of the frontalis muscle and superior 2/3 of the lateral frontalis, again with preservation of the frontal branch. No complications were noted. Light pressure was held for 5 minutes. She was instructed explicitly in post-operative care.  Botox LOT:  [id number]  

## 2022-12-24 ENCOUNTER — Ambulatory Visit
Admission: RE | Admit: 2022-12-24 | Discharge: 2022-12-24 | Disposition: A | Discharge status: Home / Self Care | Payer: Federal, State, Local not specified - PPO | Source: Ambulatory Visit | Attending: Family Medicine | Admitting: Family Medicine

## 2022-12-24 DIAGNOSIS — Z1231 Encounter for screening mammogram for malignant neoplasm of breast: Secondary | ICD-10-CM

## 2023-02-22 ENCOUNTER — Other Ambulatory Visit: Payer: Self-pay | Admitting: Family Medicine

## 2023-02-22 DIAGNOSIS — I712 Thoracic aortic aneurysm, without rupture, unspecified: Secondary | ICD-10-CM

## 2023-03-10 ENCOUNTER — Ambulatory Visit
Admission: RE | Admit: 2023-03-10 | Discharge: 2023-03-10 | Disposition: A | Discharge status: Home / Self Care | Payer: Federal, State, Local not specified - PPO | Source: Ambulatory Visit | Attending: Family Medicine | Admitting: Family Medicine

## 2023-03-10 DIAGNOSIS — I712 Thoracic aortic aneurysm, without rupture, unspecified: Secondary | ICD-10-CM

## 2023-03-10 MED ORDER — GADOPICLENOL 0.5 MMOL/ML IV SOLN
7.0000 mL | Freq: Once | INTRAVENOUS | Status: AC | PRN
  Administered 2023-03-10: 7 mL via INTRAVENOUS

## 2023-03-18 ENCOUNTER — Other Ambulatory Visit: Payer: Medicare Other

## 2023-03-28 ENCOUNTER — Encounter: Payer: Self-pay | Admitting: Plastic Surgery

## 2023-03-28 ENCOUNTER — Ambulatory Visit (INDEPENDENT_AMBULATORY_CARE_PROVIDER_SITE_OTHER): Payer: Self-pay | Admitting: Plastic Surgery

## 2023-03-28 DIAGNOSIS — Z719 Counseling, unspecified: Secondary | ICD-10-CM

## 2023-03-28 NOTE — Progress Notes (Signed)
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 I discussed with the patient this proposed procedure of botulinum toxin injections, which is customized depending on the particular needs of the patient. It is performed on facial rhytids as a temporary correction. The alternatives were discussed with the patient. The risks were addressed including bleeding, scarring, infection, damage to deeper structures, asymmetry, and chronic pain, which may occur infrequently after a procedure. The individual's choice to undergo a surgical procedure is based on the comparison of risks to potential benefits. Other risks include unsatisfactory results, brow ptosis, eyelid ptosis, allergic reaction, temporary paralysis, which should go away with time, bruising, blurring disturbances and delayed healing. Botulinum toxin injections do not arrest the aging process or produce permanent tightening of the eyelid.  Operative intervention maybe necessary to maintain the results of a blepharoplasty or botulinum toxin. The patient understands and wishes to proceed.  Procedure: The area was prepped with alcohol and dried with a clean gauze. Using a clean technique, the botulinum toxin was diluted with 1.25 cc of preservative-free normal saline which was slowly injected with an 18 gauge needle in a tuberculin syringes.  A 32 gauge needles were then used to inject the botulinum toxin. This mixture allow for an aliquot of 4 units per 0.1 cc in each injection site.    Subsequently the mixture was injected in the glabellar and forehead area with preservation of the temporal branch to the lateral eyebrow as well as into each lateral canthal area beginning from the lateral  orbital rim medial to the zygomaticus major in 3 separate areas. A total of 30 Units of botulinum toxin was used. The forehead and glabellar area was injected with care to inject intramuscular only while holding pressure on the supratrochlear vessels in each area during each injection on either side of the medial corrugators. The injection proceeded vertically superiorly to the medial 2/3 of the frontalis muscle and superior 2/3 of the lateral frontalis, again with preservation of the frontal branch.  The midface area was injected at the 3 sub-regions of the mid-face: zygomaticomalar region, anteromedial cheek region, and submalar region for a total of one syringe total with the Voluma. The technique used was serial puncture with equal injections in the 3 sub-regions: the zygomaticomalar region, the anteromedial cheek, and the submalar region.  The marionette lines were injected.  Care was taken as she had a large vessel on each side.  Half the syringe was used on each side with the Defyne. No complications were noted. Light pressure was held for 5 minutes. She was instructed explicitly in post-operative care.  Botox LOT:  [id number]  J. Voluma XC x 1  LOT: [id number]  Restylane Defyne LOT: [id number]  A spot treatment was done to the cheek on the hyperpigmented area with 15 J using the 515 nm.

## 2023-04-22 ENCOUNTER — Ambulatory Visit
Admission: RE | Admit: 2023-04-22 | Discharge: 2023-04-22 | Disposition: A | Discharge status: Home / Self Care | Payer: Self-pay | Source: Ambulatory Visit | Attending: Family Medicine | Admitting: Family Medicine

## 2023-04-22 DIAGNOSIS — E2839 Other primary ovarian failure: Secondary | ICD-10-CM

## 2023-05-03 ENCOUNTER — Ambulatory Visit (INDEPENDENT_AMBULATORY_CARE_PROVIDER_SITE_OTHER): Payer: Self-pay | Admitting: Plastic Surgery

## 2023-05-03 ENCOUNTER — Encounter: Payer: Self-pay | Admitting: Plastic Surgery

## 2023-05-03 DIAGNOSIS — Z719 Counseling, unspecified: Secondary | ICD-10-CM

## 2023-05-03 NOTE — Progress Notes (Signed)
Preoperative Dx: Hyperpigmentation of face  Postoperative Dx:  same  Procedure: laser to left cheek  Anesthesia: none  Description of Procedure:  Risks and complications were explained to the patient. Consent was confirmed and signed. Eye protection was placed. Time out was called and all information was confirmed to be correct. The area  area was prepped with alcohol and wiped dry. The BBL laser was set at 515 nm at 11 J/cm2. The left cheek was lasered. The patient tolerated the procedure well and there were no complications. The patient is to follow up in 4 weeks.

## 2023-06-27 ENCOUNTER — Ambulatory Visit (INDEPENDENT_AMBULATORY_CARE_PROVIDER_SITE_OTHER): Payer: Self-pay | Admitting: Plastic Surgery

## 2023-06-27 ENCOUNTER — Encounter: Payer: Self-pay | Admitting: Plastic Surgery

## 2023-06-27 DIAGNOSIS — Z719 Counseling, unspecified: Secondary | ICD-10-CM

## 2023-06-27 NOTE — Progress Notes (Signed)
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 I discussed with the patient this proposed procedure of botulinum toxin injections, which is customized depending on the particular needs of the patient. It is performed on facial rhytids as a temporary correction. The alternatives were discussed with the patient. The risks were addressed including bleeding, scarring, infection, damage to deeper structures, asymmetry, and chronic pain, which may occur infrequently after a procedure. The individual's choice to undergo a surgical procedure is based on the comparison of risks to potential benefits. Other risks include unsatisfactory results, brow ptosis, eyelid ptosis, allergic reaction, temporary paralysis, which should go away with time, bruising, blurring disturbances and delayed healing. Botulinum toxin injections do not arrest the aging process or produce permanent tightening of the eyelid.  Operative intervention maybe necessary to maintain the results of a blepharoplasty or botulinum toxin. The patient understands and wishes to proceed.  Procedure: The area was prepped with alcohol and dried with a clean gauze. Using a clean technique, the botulinum toxin was diluted with 1.25 cc of preservative-free normal saline which was slowly injected with an 18 gauge needle in a tuberculin syringes.  A 32 gauge needles were then used to inject the botulinum toxin. This mixture allow for an aliquot of 4 units per 0.1 cc in each injection site.    Subsequently the mixture was injected in the glabellar and forehead area with preservation of the temporal branch to the lateral eyebrow as well as into each lateral canthal area beginning from the lateral  orbital rim medial to the zygomaticus major in 3 separate areas. A total of 32 Units of botulinum toxin was used. The forehead and glabellar area was injected with care to inject intramuscular only while holding pressure on the supratrochlear vessels in each area during each injection on either side of the medial corrugators. The injection proceeded vertically superiorly to the medial 2/3 of the frontalis muscle and superior 2/3 of the lateral frontalis, again with preservation of the frontal branch.  The midface area was injected at the 3 sub-regions of the mid-face: zygomaticomalar region, anteromedial cheek region, and submalar region for a total of one syringe total.  The technique used was serial puncture with equal injections in the 3 sub-regions: the zygomaticomalar region, the anteromedial cheek, and the submalar region.  No complications were noted. Light pressure was held for 5 minutes. She was instructed explicitly in post-operative care.  Botox LOT:  [id number]  J. Neomia Dear LOT: [id number]

## 2023-08-12 ENCOUNTER — Telehealth: Payer: Self-pay | Admitting: Plastic Surgery

## 2023-08-12 MED ORDER — LIDOCAINE 23% - TETRACAINE 7% TOPICAL OINTMENT (PLASTICIZED): 1.0000 | TOPICAL_OINTMENT | Freq: Once | CUTANEOUS | 0 refills | Status: AC

## 2023-08-12 NOTE — Telephone Encounter (Signed)
Lidocaine and tetracaine script sent to [facility] outpatient pharmacy

## 2023-08-15 ENCOUNTER — Ambulatory Visit (INDEPENDENT_AMBULATORY_CARE_PROVIDER_SITE_OTHER): Payer: Self-pay | Admitting: Plastic Surgery

## 2023-08-15 ENCOUNTER — Other Ambulatory Visit (HOSPITAL_COMMUNITY): Payer: Self-pay

## 2023-08-15 DIAGNOSIS — Z719 Counseling, unspecified: Secondary | ICD-10-CM

## 2023-08-15 MED ORDER — LIDOCAINE 23% - TETRACAINE 7% TOPICAL OINTMENT (PLASTICIZED)
TOPICAL_OINTMENT | CUTANEOUS | 0 refills | Status: DC
  Filled 2023-08-15: qty 60, 1d supply, fill #0

## 2023-08-15 NOTE — Progress Notes (Signed)
[facility]  Preoperative Dx: Hyperpigmentation of face  Postoperative Dx:  same  Procedure: laser to face and neck and hands  Anesthesia: none  Description of Procedure:  Risks and complications were explained to the patient. Consent was confirmed and signed. Eye protection was placed. Time out was called and all information was confirmed to be correct. The area  area was prepped with alcohol and wiped dry. The [place] laser was set at level 3 at 15 m. The face was lasered.  The neck was later done but set at level 2 at 10 m.  The hands were done at level 1 using 5 m 6 passes were done to all areas.  The patient tolerated the procedure well and there were no complications. The patient is to follow up in 4 weeks.  Moxi is designed to provide total and textural improvement to skin

## 2023-09-20 ENCOUNTER — Encounter: Payer: Self-pay | Admitting: Plastic Surgery

## 2023-09-20 ENCOUNTER — Ambulatory Visit (INDEPENDENT_AMBULATORY_CARE_PROVIDER_SITE_OTHER): Payer: Self-pay | Admitting: Plastic Surgery

## 2023-09-20 VITALS — BP 121/78 | HR 65 | Ht 63.0 in | Wt 135.6 lb

## 2023-09-20 DIAGNOSIS — Z719 Counseling, unspecified: Secondary | ICD-10-CM

## 2023-09-20 NOTE — Progress Notes (Signed)
Preoperative Dx: hyperpigmentation of face  Postoperative Dx:  same  Procedure: laser to face at spots of pigment   Anesthesia: none  Description of Procedure:  Risks and complications were explained to the patient. Consent was confirmed and signed. Eye protection was placed. Time out was called and all information was confirmed to be correct. The area  area was prepped with alcohol and wiped dry. The BBL laser was set at 515 nm and preset J/cm2. The spots were lasered. The TRL was then set at 20nm and the left periorbital area was lasered. The patient tolerated the procedure well and there were no complications. The patient is to follow up in 4 weeks.

## 2023-09-27 ENCOUNTER — Other Ambulatory Visit: Payer: Medicare Other | Admitting: Plastic Surgery

## 2023-11-04 ENCOUNTER — Ambulatory Visit (INDEPENDENT_AMBULATORY_CARE_PROVIDER_SITE_OTHER): Payer: Self-pay | Admitting: Plastic Surgery

## 2023-11-04 ENCOUNTER — Encounter: Payer: Self-pay | Admitting: Plastic Surgery

## 2023-11-04 DIAGNOSIS — Z719 Counseling, unspecified: Secondary | ICD-10-CM

## 2023-11-04 NOTE — Progress Notes (Signed)
Preoperative Dx: hyperpigmentation and redness of face  Postoperative Dx:  same  Procedure: laser to face   Anesthesia: none  Description of Procedure:  Risks and complications were explained to the patient. Consent was confirmed and signed. Eye protection was placed. Time out was called and all information was confirmed to be correct. The area  area was prepped with alcohol and wiped dry. The Heroic laser was set at pigment 515 nm preset J/cm2. The hyperpigmented areas were lasered.  Then I changed the setting to facial vessels and used the 560 nm at the presettings and got to red spots.  The TRL was then used on the lesion on the left cheek and then at the left lateral nose.  This was done with 20 m. The patient tolerated the procedure well and there were no complications. The patient is to follow up in 4 weeks.  Botulinum Toxin Procedure Note  Procedure: Cosmetic botulinum toxin   Pre-operative Diagnosis: Dynamic rhytides   Post-operative Diagnosis: Same  Complications:  None  Brief history: The patient desires botulinum toxin injection of her forehead. I discussed with the patient this proposed procedure of botulinum toxin injections, which is customized depending on the particular needs of the patient. It is performed on facial rhytids as a temporary correction. The alternatives were discussed with the patient. The risks were addressed including bleeding, scarring, infection, damage to deeper structures, asymmetry, and chronic pain, which may occur infrequently after a procedure. The individual's choice to undergo a surgical procedure is based on the comparison of risks to potential benefits. Other risks include unsatisfactory results, brow ptosis, eyelid ptosis, allergic reaction, temporary paralysis, which should go away with time, bruising, blurring disturbances and delayed healing. Botulinum toxin injections do not arrest the aging process or produce permanent tightening of the eyelid.   Operative intervention maybe necessary to maintain the results of a blepharoplasty or botulinum toxin. The patient understands and wishes to proceed.  Procedure: The area was prepped with alcohol and dried with a clean gauze. Using a clean technique, the botulinum toxin was diluted with 1.25 cc of preservative-free normal saline which was slowly injected with an 18 gauge needle in a tuberculin syringes.  A 32 gauge needles were then used to inject the botulinum toxin. This mixture allow for an aliquot of 4 units per 0.1 cc in each injection site.    Subsequently the mixture was injected in the glabellar and forehead area with preservation of the temporal branch to the lateral eyebrow as well as into each lateral canthal area beginning from the lateral orbital rim medial to the zygomaticus major in 3 separate areas. A total of 28 Units of botulinum toxin was used. The forehead and glabellar area was injected with care to inject intramuscular only while holding pressure on the supratrochlear vessels in each area during each injection on either side of the medial corrugators. The injection proceeded vertically superiorly to the medial 2/3 of the frontalis muscle and superior 2/3 of the lateral frontalis, again with preservation of the frontal branch. No complications were noted. Light pressure was held for 5 minutes. She was instructed explicitly in post-operative care.  Botox LOT:  [id number]

## 2023-11-08 ENCOUNTER — Encounter: Payer: Medicare Other | Admitting: Plastic Surgery

## 2023-11-22 ENCOUNTER — Telehealth: Payer: Self-pay | Admitting: Plastic Surgery

## 2023-11-22 ENCOUNTER — Ambulatory Visit (INDEPENDENT_AMBULATORY_CARE_PROVIDER_SITE_OTHER): Payer: Self-pay | Admitting: Plastic Surgery

## 2023-11-22 DIAGNOSIS — Z719 Counseling, unspecified: Secondary | ICD-10-CM

## 2023-11-22 NOTE — Progress Notes (Signed)
 Preoperative Dx: hyperpigmentation of left cheek  Postoperative Dx:  same  Procedure: laser to left cheek   Anesthesia: none  Description of Procedure:  Risks and complications were explained to the patient. Consent was confirmed and signed. Eye protection was placed. Time out was called and all information was confirmed to be correct. The area  area was prepped with alcohol and wiped dry. The heroic laser was set at 532 nm and vascular setting. The left cheek was lasered. The patient tolerated the procedure well and there were no complications. The patient is to follow up in 4 weeks.

## 2023-11-22 NOTE — Telephone Encounter (Signed)
 provider out of office at 2pm, pt agreed to come in at 12noon

## 2024-03-02 ENCOUNTER — Other Ambulatory Visit: Payer: Self-pay | Admitting: Family Medicine

## 2024-03-02 DIAGNOSIS — Z1231 Encounter for screening mammogram for malignant neoplasm of breast: Secondary | ICD-10-CM

## 2024-03-05 ENCOUNTER — Other Ambulatory Visit: Payer: Self-pay | Admitting: Family Medicine

## 2024-03-05 DIAGNOSIS — I712 Thoracic aortic aneurysm, without rupture, unspecified: Secondary | ICD-10-CM

## 2024-03-12 ENCOUNTER — Ambulatory Visit
Admission: RE | Admit: 2024-03-12 | Discharge: 2024-03-12 | Disposition: A | Discharge status: Home / Self Care | Source: Ambulatory Visit | Attending: Family Medicine | Admitting: Family Medicine

## 2024-03-12 DIAGNOSIS — Z1231 Encounter for screening mammogram for malignant neoplasm of breast: Secondary | ICD-10-CM

## 2024-04-20 ENCOUNTER — Encounter: Payer: Self-pay | Admitting: Plastic Surgery

## 2024-04-20 ENCOUNTER — Ambulatory Visit (INDEPENDENT_AMBULATORY_CARE_PROVIDER_SITE_OTHER): Payer: Self-pay | Admitting: Plastic Surgery

## 2024-04-20 ENCOUNTER — Ambulatory Visit
Admission: RE | Admit: 2024-04-20 | Discharge: 2024-04-20 | Disposition: A | Discharge status: Home / Self Care | Source: Ambulatory Visit | Attending: Family Medicine | Admitting: Family Medicine

## 2024-04-20 DIAGNOSIS — Z719 Counseling, unspecified: Secondary | ICD-10-CM

## 2024-04-20 DIAGNOSIS — I712 Thoracic aortic aneurysm, without rupture, unspecified: Secondary | ICD-10-CM

## 2024-04-20 MED ORDER — GADOPICLENOL 0.5 MMOL/ML IV SOLN
6.0000 mL | Freq: Once | INTRAVENOUS | Status: AC | PRN
  Administered 2024-04-20: 6 mL via INTRAVENOUS

## 2024-04-20 NOTE — Progress Notes (Signed)

## 2024-07-17 ENCOUNTER — Encounter: Payer: Self-pay | Admitting: Plastic Surgery

## 2024-10-04 ENCOUNTER — Encounter (HOSPITAL_BASED_OUTPATIENT_CLINIC_OR_DEPARTMENT_OTHER): Payer: Self-pay

## 2024-10-04 ENCOUNTER — Other Ambulatory Visit: Payer: Self-pay

## 2024-10-04 ENCOUNTER — Emergency Department (HOSPITAL_BASED_OUTPATIENT_CLINIC_OR_DEPARTMENT_OTHER)

## 2024-10-04 ENCOUNTER — Inpatient Hospital Stay (HOSPITAL_BASED_OUTPATIENT_CLINIC_OR_DEPARTMENT_OTHER)
Admission: EM | Admit: 2024-10-04 | Discharge: 2024-10-08 | DRG: 392 | Disposition: A | Discharge status: Home / Self Care | Attending: Emergency Medicine | Admitting: Emergency Medicine

## 2024-10-04 DIAGNOSIS — R079 Chest pain, unspecified: Secondary | ICD-10-CM

## 2024-10-04 DIAGNOSIS — R1013 Epigastric pain: Secondary | ICD-10-CM

## 2024-10-04 DIAGNOSIS — Z7983 Long term (current) use of bisphosphonates: Secondary | ICD-10-CM

## 2024-10-04 DIAGNOSIS — E785 Hyperlipidemia, unspecified: Secondary | ICD-10-CM | POA: Diagnosis present

## 2024-10-04 DIAGNOSIS — Z79890 Hormone replacement therapy (postmenopausal): Secondary | ICD-10-CM

## 2024-10-04 DIAGNOSIS — Z8249 Family history of ischemic heart disease and other diseases of the circulatory system: Secondary | ICD-10-CM

## 2024-10-04 DIAGNOSIS — I214 Non-ST elevation (NSTEMI) myocardial infarction: Secondary | ICD-10-CM

## 2024-10-04 DIAGNOSIS — R7989 Other specified abnormal findings of blood chemistry: Principal | ICD-10-CM | POA: Diagnosis present

## 2024-10-04 DIAGNOSIS — F419 Anxiety disorder, unspecified: Secondary | ICD-10-CM | POA: Diagnosis present

## 2024-10-04 DIAGNOSIS — Z7982 Long term (current) use of aspirin: Secondary | ICD-10-CM

## 2024-10-04 DIAGNOSIS — Z79899 Other long term (current) drug therapy: Secondary | ICD-10-CM

## 2024-10-04 DIAGNOSIS — R946 Abnormal results of thyroid function studies: Secondary | ICD-10-CM | POA: Diagnosis present

## 2024-10-04 DIAGNOSIS — I712 Thoracic aortic aneurysm, without rupture, unspecified: Secondary | ICD-10-CM | POA: Diagnosis present

## 2024-10-04 DIAGNOSIS — E876 Hypokalemia: Secondary | ICD-10-CM | POA: Diagnosis present

## 2024-10-04 DIAGNOSIS — I2510 Atherosclerotic heart disease of native coronary artery without angina pectoris: Secondary | ICD-10-CM | POA: Diagnosis present

## 2024-10-04 DIAGNOSIS — Z880 Allergy status to penicillin: Secondary | ICD-10-CM

## 2024-10-04 DIAGNOSIS — I429 Cardiomyopathy, unspecified: Secondary | ICD-10-CM | POA: Diagnosis present

## 2024-10-04 DIAGNOSIS — I7121 Aneurysm of the ascending aorta, without rupture: Secondary | ICD-10-CM | POA: Diagnosis present

## 2024-10-04 DIAGNOSIS — K2980 Duodenitis without bleeding: Secondary | ICD-10-CM | POA: Diagnosis not present

## 2024-10-04 DIAGNOSIS — I2489 Other forms of acute ischemic heart disease: Secondary | ICD-10-CM | POA: Diagnosis present

## 2024-10-04 HISTORY — DX: Thoracic aortic aneurysm, without rupture, unspecified: I71.20

## 2024-10-04 HISTORY — DX: Hyperlipidemia, unspecified: E78.5

## 2024-10-04 LAB — COMPREHENSIVE METABOLIC PANEL WITH GFR
ALT: 16 U/L (ref 0–44)
AST: 19 U/L (ref 15–41)
Albumin: 4.1 g/dL (ref 3.5–5.0)
Alkaline Phosphatase: 78 U/L (ref 38–126)
Anion gap: 11 (ref 5–15)
BUN: 10 mg/dL (ref 8–23)
CO2: 24 mmol/L (ref 22–32)
Calcium: 9.9 mg/dL (ref 8.9–10.3)
Chloride: 105 mmol/L (ref 98–111)
Creatinine, Ser: 0.68 mg/dL (ref 0.44–1.00)
GFR, Estimated: >60 mL/min (ref >60)
Glucose, Bld: 104 mg/dL — ABNORMAL HIGH (ref 70–99)
Potassium: 4.4 mmol/L (ref 3.5–5.1)
Sodium: 140 mmol/L (ref 135–145)
Total Bilirubin: 0.5 mg/dL (ref 0.0–1.2)
Total Protein: 7.2 g/dL (ref 6.5–8.1)

## 2024-10-04 LAB — CBC WITH DIFFERENTIAL/PLATELET
Abs Immature Granulocytes: 0.01 K/uL (ref 0.00–0.07)
Basophils Absolute: 0 K/uL (ref 0.0–0.1)
Basophils Relative: 0 %
Eosinophils Absolute: 0 K/uL (ref 0.0–0.5)
Eosinophils Relative: 1 %
HCT: 41.6 % (ref 36.0–46.0)
Hemoglobin: 14.1 g/dL (ref 12.0–15.0)
Immature Granulocytes: 0 %
Lymphocytes Relative: 23 %
Lymphs Abs: 1.6 K/uL (ref 0.7–4.0)
MCH: 32.1 pg (ref 26.0–34.0)
MCHC: 33.9 g/dL (ref 30.0–36.0)
MCV: 94.8 fL (ref 80.0–100.0)
Monocytes Absolute: 0.6 K/uL (ref 0.1–1.0)
Monocytes Relative: 9 %
Neutro Abs: 4.7 K/uL (ref 1.7–7.7)
Neutrophils Relative %: 67 %
Platelets: 234 K/uL (ref 150–400)
RBC: 4.39 MIL/uL (ref 3.87–5.11)
RDW: 11.9 % (ref 11.5–15.5)
WBC: 7 K/uL (ref 4.0–10.5)
nRBC: 0 % (ref 0.0–0.2)

## 2024-10-04 LAB — TROPONIN T, HIGH SENSITIVITY
Troponin T High Sensitivity: 95 ng/L — ABNORMAL HIGH (ref 0–19)
Troponin T High Sensitivity: 81 ng/L — ABNORMAL HIGH (ref 0–19)

## 2024-10-04 LAB — D-DIMER, QUANTITATIVE: D-Dimer, Quant: 1.31 ug{FEU}/mL — ABNORMAL HIGH (ref 0.00–0.50)

## 2024-10-04 LAB — LIPASE, BLOOD: Lipase: 28 U/L (ref 11–51)

## 2024-10-04 MED ORDER — IOHEXOL 350 MG/ML SOLN
100.0000 mL | Freq: Once | INTRAVENOUS | Status: AC | PRN
  Administered 2024-10-04: 100 mL via INTRAVENOUS

## 2024-10-04 MED ORDER — ONDANSETRON HCL 4 MG/2ML IJ SOLN: 4.0000 mg | Freq: Four times a day (QID) | INTRAMUSCULAR | Status: DC | PRN

## 2024-10-04 MED ORDER — ACETAMINOPHEN 325 MG PO TABS: 650.0000 mg | ORAL_TABLET | Freq: Four times a day (QID) | ORAL | Status: DC | PRN

## 2024-10-04 MED ORDER — ONDANSETRON HCL 4 MG/2ML IJ SOLN
4.0000 mg | Freq: Once | INTRAMUSCULAR | Status: AC
  Administered 2024-10-04: 4 mg via INTRAVENOUS
  Filled 2024-10-04: qty 2

## 2024-10-04 MED ORDER — SODIUM CHLORIDE 0.9 % IV BOLUS
1000.0000 mL | Freq: Once | INTRAVENOUS | Status: AC
  Administered 2024-10-04: 1000 mL via INTRAVENOUS

## 2024-10-04 MED ORDER — PANTOPRAZOLE SODIUM 40 MG IV SOLR
40.0000 mg | Freq: Two times a day (BID) | INTRAVENOUS | Status: DC
  Administered 2024-10-04 – 2024-10-06 (×5): 40 mg via INTRAVENOUS
  Filled 2024-10-04 (×7): qty 10

## 2024-10-04 MED ORDER — AMITRIPTYLINE HCL 10 MG PO TABS
10.0000 mg | ORAL_TABLET | Freq: Every day | ORAL | Status: DC
  Administered 2024-10-04 – 2024-10-05 (×2): 10 mg via ORAL
  Filled 2024-10-04 (×3): qty 1

## 2024-10-04 MED ORDER — ATORVASTATIN CALCIUM 40 MG PO TABS
40.0000 mg | ORAL_TABLET | Freq: Every day | ORAL | Status: DC
  Administered 2024-10-05 – 2024-10-08 (×4): 40 mg via ORAL
  Filled 2024-10-04 (×4): qty 1

## 2024-10-04 MED ORDER — HEPARIN (PORCINE) 25000 UT/250ML-% IV SOLN
800.0000 [IU]/h | INTRAVENOUS | Status: DC
  Administered 2024-10-04: 800 [IU]/h via INTRAVENOUS
  Filled 2024-10-04: qty 250

## 2024-10-04 MED ORDER — ENOXAPARIN SODIUM 40 MG/0.4ML IJ SOSY
40.0000 mg | PREFILLED_SYRINGE | INTRAMUSCULAR | Status: DC
  Administered 2024-10-04 – 2024-10-05 (×2): 40 mg via SUBCUTANEOUS
  Filled 2024-10-04 (×2): qty 0.4

## 2024-10-04 MED ORDER — LACTATED RINGERS IV SOLN: INTRAVENOUS | Status: AC

## 2024-10-04 MED ORDER — MORPHINE SULFATE (PF) 2 MG/ML IV SOLN: 1.0000 mg | INTRAVENOUS | Status: DC | PRN

## 2024-10-04 MED ORDER — ENOXAPARIN SODIUM 40 MG/0.4ML IJ SOSY: 40.0000 mg | PREFILLED_SYRINGE | INTRAMUSCULAR | Status: DC

## 2024-10-04 MED ORDER — HEPARIN BOLUS VIA INFUSION
3700.0000 [IU] | Freq: Once | INTRAVENOUS | Status: AC
  Administered 2024-10-04: 3700 [IU] via INTRAVENOUS

## 2024-10-04 MED ORDER — SODIUM CHLORIDE 0.9% FLUSH
3.0000 mL | Freq: Two times a day (BID) | INTRAVENOUS | Status: DC
  Administered 2024-10-04 – 2024-10-08 (×7): 3 mL via INTRAVENOUS

## 2024-10-04 MED ORDER — ACETAMINOPHEN 650 MG RE SUPP: 650.0000 mg | Freq: Four times a day (QID) | RECTAL | Status: DC | PRN

## 2024-10-04 MED ORDER — HYDROCODONE-ACETAMINOPHEN 5-325 MG PO TABS: 1.0000 | ORAL_TABLET | ORAL | Status: DC | PRN

## 2024-10-04 MED ORDER — MORPHINE SULFATE (PF) 4 MG/ML IV SOLN
4.0000 mg | Freq: Once | INTRAVENOUS | Status: AC
  Administered 2024-10-04: 4 mg via INTRAVENOUS
  Filled 2024-10-04 (×2): qty 1

## 2024-10-04 MED ORDER — SENNOSIDES-DOCUSATE SODIUM 8.6-50 MG PO TABS: 1.0000 | ORAL_TABLET | Freq: Every evening | ORAL | Status: DC | PRN

## 2024-10-04 MED ORDER — ONDANSETRON HCL 4 MG PO TABS: 4.0000 mg | ORAL_TABLET | Freq: Four times a day (QID) | ORAL | Status: DC | PRN

## 2024-10-04 NOTE — ED Notes (Signed)
 Called [facility] to transport the patient to [facility] 5[facility] rm#[id number]

## 2024-10-04 NOTE — H&P (Signed)
 History and Physical    Weston Fulco FMW:[id number] DOB: 06-16-1956 DOA: 10/04/2024  PCP: Cleotilde Planas, MD  Patient coming from: Home  I have personally briefly reviewed patient's old medical records in [facility]  Chief Complaint: Abdominal pain  HPI: Lindsey Cooper is a 68 y.o. female with medical history significant for HLD, ascending thoracic aortic aneurysm who presented to the ED for evaluation of abdominal pain.  Patient reports developing new onset epigastric abdominal pain with nausea and vomiting this past Saturday (09/29/2024).  She thought she might have had caught a stomach bug.  She took some Pepto-Bismol and antiacid with some mild relief.  She felt well on Sunday but on Monday she had recurrent abdominal pain.  She says she has not really had any further nausea and vomiting but has had some dry heaves.  She reports having normal bowel movements without diarrhea.  She has not had fevers, chills, diaphoresis.  She has not had any chest pain.  She was seen in the [facility] earlier today who sent her to the ED for further evaluation.  She denies any new medications or NSAID use.  [facility] ED Course  Labs/Imaging on admission: I have personally reviewed following labs and imaging studies.  Initial vitals showed BP 123/80, pulse 78, RR 19, temp 98.0 F, SpO2 98% on room air.  Labs showed sodium 140, potassium 4.4, bicarb 24, BUN 10, creatinine 0.68, serum glucose 104, LFTs within normal limits, lipase 28, WBC 7.0, hemoglobin 14.1, platelets 234.  D-dimer 1.31.  Troponin T 95 > 81.  CTA chest and CT abdomen/pelvis with contrast obtained.  No evidence of acute pulmonary disease.  No acute pulmonary parenchymal findings.  Aneurysmal dilatation of the ascending thoracic aorta up to 4.5 cm noted.  Duodenitis involving the pylorus and 1st and 2nd portions of the duodenum, with trace inflammatory stranding in the adjacent retroperitoneal fat.  No evidence of  pancreatitis.  Patient was given 1 L normal saline, IV morphine 4 mg.  EDP reportedly discussed with cardiology who recommended starting IV heparin (no documentation regarding this at the time of this writing).  The hospitalist service was consulted for transfer and admission.  Review of Systems: All systems reviewed and are negative except as documented in history of present illness above.   Past Medical History:  Diagnosis Date   Hyperlipidemia    Thoracic aortic aneurysm     Past Surgical History:  Procedure Laterality Date   marker placed in breast      Social History: Social History   Tobacco Use   Smoking status: Never   Smokeless tobacco: Never  Substance Use Topics   Alcohol use: Never   Drug use: Never    Allergies  Allergen Reactions   Penicillins Other (See Comments)    Other reaction(s): YEAST INFECTION    Family History  Problem Relation Age of Onset   Heart attack Mother    Heart attack Father      Prior to Admission medications   Medication Sig Start Date End Date Taking? Authorizing Provider  amitriptyline (ELAVIL) 10 MG tablet Take 10 mg by mouth at bedtime.   Yes [provider]  atorvastatin (LIPITOR) 40 MG tablet Take 1 tablet by mouth daily. 04/03/21  Yes [provider]  estradiol (ESTRACE) 0.1 MG/GM vaginal cream Place 1 Applicatorful vaginally as needed. 06/18/24  Yes [provider]  FOSAMAX 70 MG tablet Take 70 mg by mouth once a week.   Yes [provider]  Multiple Vitamins-Minerals (HAIR SKIN & NAILS) TABS Take 1 tablet by mouth daily.   Yes [provider]  Multiple Vitamins-Minerals (MULTI FOR HER 50+) TABS Take 1 tablet by mouth daily.   Yes [provider]  OVER THE COUNTER MEDICATION Take 1 tablet by mouth daily. *vitamin d po, vitamin c po, calcium po, collagen po   Yes [provider]  rizatriptan (MAXALT) 10 MG tablet Take 1 tablet by mouth as needed. 12/10/16  Yes  [provider]    Physical Exam: Vitals:   10/04/24 1700 10/04/24 1730 10/04/24 1831 10/04/24 1933  BP: 129/81 127/75 135/78 124/63  Pulse: 74 69 62 74  Resp: (!) 25 19 16 18   Temp:   (!) 97.5 F (36.4 C) 97.9 F (36.6 C)  TempSrc:   Oral   SpO2: 97% 97% 98% 96%  Weight:      Height:       Constitutional: Resting in bed, NAD, calm, comfortable Eyes: EOMI, lids and conjunctivae normal ENMT: Mucous membranes are moist. Posterior pharynx clear of any exudate or lesions.Normal dentition.  Neck: normal, supple, no masses. Respiratory: clear to auscultation bilaterally, no wheezing, no crackles. Normal respiratory effort. No accessory muscle use.  Cardiovascular: Regular rate and rhythm, no murmurs / rubs / gallops. No extremity edema. 2+ pedal pulses. Abdomen: Soft, no tenderness, no masses palpated. Musculoskeletal: no clubbing / cyanosis. No joint deformity upper and lower extremities. Good ROM, no contractures. Normal muscle tone.  Skin: no rashes, lesions, ulcers. No induration Neurologic: Sensation intact. Strength 5/5 in all 4.  Psychiatric: Normal judgment and insight. Alert and oriented x 3. Normal mood.   EKG: Personally reviewed. Sinus rhythm, rate 72, PAC, no acute ischemic changes.  No prior for comparison.  Assessment/Plan Principal Problem:   Duodenitis Active Problems:   Thoracic aortic aneurysm without rupture   Elevated troponin   Hyperlipidemia   Lindsey Cooper is a 68 y.o. female with medical history significant for HLD, ascending thoracic aortic aneurysm who is admitted with duodenitis.  Assessment and Plan: Duodenitis: History and imaging consistent with duodenitis as etiology of patient's acute symptoms.  Pain and nausea has improved and she wants to try advancing diet. - IV fluid hydration overnight - Advance diet as tolerated - Analgesics and antiemetics ordered as needed  Elevated troponin: Initial troponin T 95 >> 81 on repeat.  She has  not had any chest pain.  EKG without any significant ischemic changes.  CTA chest reassuring.  She was started on IV heparin in the ED which I have discontinued.  Cardiology have evaluated her and recommended obtaining echocardiogram otherwise no further cardiac workup at this time. - Echo ordered for a.m.  Hyperlipidemia: Continue atorvastatin.  Anxiety: Continue amitriptyline.  Ascending thoracic aortic aneurysm: CT shows 4.5 cm aneurysmal dilatation of the ascending thoracic aorta.  This is being followed by her PCP as an outpatient.    DVT prophylaxis: enoxaparin (LOVENOX) injection 40 mg Start: 10/04/24 2000 Code Status: Full code, confirmed with patient on admission Family Communication: Discussed with patient, she has discussed with family Disposition Plan: From home and likely discharge to home pending clinical progress Consults called: EDP discussed with cardiology Severity of Illness: The appropriate patient status for this patient is OBSERVATION. Observation status is judged to be reasonable and necessary in order to provide the required intensity of service to ensure the patient's safety. The patient's presenting symptoms, physical exam findings, and initial radiographic and laboratory data in the  context of their medical condition is felt to place them at decreased risk for further clinical deterioration. Furthermore, it is anticipated that the patient will be medically stable for discharge from the hospital within 2 midnights of admission.   Jorie Blanch MD [facility]  If 7PM-7AM, please contact night-coverage [email or web address]  10/04/2024, 8:24 PM

## 2024-10-04 NOTE — Consult Note (Signed)
 Cardiology Consultation   Patient ID: Lindsey Cooper MRN: [id number]; DOB: [birth year]  Admit date: 10/04/2024 Date of Consult: 10/04/2024  PCP:  Cleotilde Planas, MD   [facility] Providers Cardiologist: New to [facility]   Patient Profile: Lindsey Cooper is a 68 y.o. female with a hx of hyperlipidemia and thoracic aortic aneurysm who is being seen 10/04/2024 for the evaluation of epigastric pain at the request of Dr. Tobie.  History of Present Illness: Lindsey Cooper is a pleasant 68 year old female with past medical history of hyperlipidemia and thoracic aortic aneurysm followed by PCP.  She is independent.  She has tried to be active in the past month by using an elliptical bike.  She is able to ride the elliptical bike for 10 minutes without any exertional chest pain or shortness of breath.  She frequently participate in church activities and accompanying church members to their doctors appointment.  She was in her usual state of health until Saturday when she started having nausea and epigastric pain.  Symptom improved on Sunday however returned on Monday.  Since Monday, her epigastric pain is essentially constant until earlier today.  Due to persistent symptom, she ultimately sought medical attention at [facility].  Serial troponin 95--> 81.  Renal function and electrolyte normal.  CBC normal.  D-dimer elevated 1.31.  CTA of the chest and CT of abdomen showed no evidence of PE, no acute pulmonary finding, dilated ascending aorta measurement of 4.5 cm, evidence of duodenitis involving the pylorus and the 1st and 2nd portion of the duodenum.  No evidence of pancreatitis.  Chest x-ray was negative for acute process.  EKG showed normal sinus rhythm, PACs and nonspecific T wave changes.   Past Medical History:  Diagnosis Date   Hyperlipidemia    Thoracic aortic aneurysm     Past Surgical History:  Procedure Laterality Date   marker placed in breast       Home  Medications:  Prior to Admission medications   Medication Sig Start Date End Date Taking? Authorizing Provider  amitriptyline (ELAVIL) 10 MG tablet Take 10 mg by mouth at bedtime.   Yes [provider]  atorvastatin (LIPITOR) 40 MG tablet Take 1 tablet by mouth daily. 04/03/21  Yes [provider]  estradiol (ESTRACE) 0.1 MG/GM vaginal cream Place 1 Applicatorful vaginally as needed. 06/18/24  Yes [provider]  FOSAMAX 70 MG tablet Take 70 mg by mouth once a week.   Yes [provider]  Multiple Vitamins-Minerals (HAIR SKIN & NAILS) TABS Take 1 tablet by mouth daily.   Yes [provider]  Multiple Vitamins-Minerals (MULTI FOR HER 50+) TABS Take 1 tablet by mouth daily.   Yes [provider]  OVER THE COUNTER MEDICATION Take 1 tablet by mouth daily. *vitamin d po, vitamin c po, calcium po, collagen po   Yes [provider]  rizatriptan (MAXALT) 10 MG tablet Take 1 tablet by mouth as needed. 12/10/16  Yes [provider]    Scheduled Meds:  amitriptyline  10 mg Oral QHS   [START ON 10/05/2024] atorvastatin  40 mg Oral Daily   enoxaparin (LOVENOX) injection  40 mg Subcutaneous Q24H   sodium chloride flush  3 mL Intravenous Q12H   Continuous Infusions:  lactated ringers     PRN Meds: acetaminophen **OR** acetaminophen, HYDROcodone-acetaminophen, morphine injection, ondansetron **OR** ondansetron (ZOFRAN) IV, senna-docusate  Allergies:    Allergies  Allergen Reactions   Penicillins Other (See Comments)    Other reaction(s): YEAST  INFECTION    Social History:   Social History   Socioeconomic History   Marital status: Married    Spouse name: Not on file   Number of children: Not on file   Years of education: Not on file   Highest education level: Not on file  Occupational History   Not on file  Tobacco Use   Smoking status: Never   Smokeless tobacco: Never  Substance and Sexual Activity   Alcohol use:  Never   Drug use: Never   Sexual activity: Not on file  Other Topics Concern   Not on file  Social History Narrative   Not on file   Social Drivers of Health   Financial Resource Strain: Not on file  Food Insecurity: Not on file  Transportation Needs: Not on file  Physical Activity: Not on file  Stress: Not on file  Social Connections: Not on file  Intimate Partner Violence: Not on file    Family History:    Family History  Problem Relation Age of Onset   Heart attack Mother    Heart attack Father      ROS:  Please see the history of present illness.   All other ROS reviewed and negative.     Physical Exam/Data: Vitals:   10/04/24 1700 10/04/24 1730 10/04/24 1831 10/04/24 1933  BP: 129/81 127/75 135/78 124/63  Pulse: 74 69 62 74  Resp: (!) 25 19 16 18   Temp:   (!) 97.5 F (36.4 C) 97.9 F (36.6 C)  TempSrc:   Oral   SpO2: 97% 97% 98% 96%  Weight:      Height:        Intake/Output Summary (Last 24 hours) at 10/04/2024 1956 Last data filed at 10/04/2024 1251 Gross per 24 hour  Intake 1000 ml  Output --  Net 1000 ml      10/04/2024    1:00 PM 09/20/2023   10:08 AM  Last 3 Weights  Weight (lbs) 137 lb 135 lb 9.6 oz  Weight (kg) 62.143 kg 61.508 kg     Body mass index is 23.15 kg/m.  General:  Well nourished, well developed, in no acute distress HEENT: normal Neck: no JVD Vascular: No carotid bruits; Distal pulses 2+ bilaterally Cardiac:  normal S1, S2; RRR; no murmur  Lungs:  clear to auscultation bilaterally, no wheezing, rhonchi or rales  Abd: soft, nontender, no hepatomegaly  Ext: no edema Musculoskeletal:  No deformities, BUE and BLE strength normal and equal Skin: warm and dry  Neuro:  CNs 2-12 intact, no focal abnormalities noted Psych:  Normal affect   EKG:  The EKG was personally reviewed and demonstrates: Normal sinus rhythm, PACs, nonspecific T wave changes. Telemetry:  Telemetry was personally reviewed and demonstrates: Sinus rhythm,  no significant ventricular ectopy.  Relevant CV Studies:  N/A  Laboratory Data: High Sensitivity Troponin:  No results for input(s): TROPONINIHS in the last 720 hours.   Chemistry Recent Labs  Lab 10/04/24 0952  NA 140  K 4.4  CL 105  CO2 24  GLUCOSE 104*  BUN 10  CREATININE 0.68  CALCIUM 9.9  GFRNONAA >60  ANIONGAP 11    Recent Labs  Lab 10/04/24 0952  PROT 7.2  ALBUMIN 4.1  AST 19  ALT 16  ALKPHOS 78  BILITOT 0.5   Lipids No results for input(s): CHOL, TRIG, HDL, LABVLDL, LDLCALC, CHOLHDL in the last 168 hours.  Hematology Recent Labs  Lab 10/04/24 0952  WBC 7.0  RBC  4.39  HGB 14.1  HCT 41.6  MCV 94.8  MCH 32.1  MCHC 33.9  RDW 11.9  PLT 234   Thyroid  No results for input(s): TSH, FREET4 in the last 168 hours.  BNPNo results for input(s): BNP, PROBNP in the last 168 hours.  DDimer  Recent Labs  Lab 10/04/24 1009  DDIMER 1.31*    Radiology/Studies:  CT Angio Chest PE W and/or Wo Contrast Result Date: 10/04/2024 EXAM: CTA CHEST PE WITH AND WITHOUT AND WITH CONTRAST CT ABDOMEN AND PELVIS WITH AND WITHOUT AND WITH CONTRAST 10/04/2024 11:38:14 AM TECHNIQUE: CTA of the chest was performed after the administration of intravenous contrast. Multiplanar reformatted images are provided for review. MIP images are provided for review. CT of the abdomen and pelvis was performed with and without the administration of intravenous contrast. Automated exposure control, iterative reconstruction, and/or weight based adjustment of the mA/kV was utilized to reduce the radiation dose to as low as reasonably achievable. COMPARISON: None available. CLINICAL HISTORY: Pulmonary embolism (PE) suspected, low to intermediate prob, positive D-dimer. Epigastric pain. FINDINGS: CHEST: PULMONARY ARTERIES: Pulmonary arteries are adequately opacified for evaluation. No intraluminal filling defect to suggest pulmonary embolism. Main pulmonary artery is normal in caliber.  MEDIASTINUM: Aneurysmal dilatation of the ascending thoracic aorta to 4.5 cm on coronal image 87 x 5. Aortic arch. No mediastinal lymphadenopathy. The heart and pericardium demonstrate no acute abnormality. LUNGS AND PLEURA: The lungs are without acute process. No pulmonary infarction. No pneumonia. No focal consolidation or pulmonary edema. No pleural fluid. No pneumothorax. SOFT TISSUES AND BONES: No acute bone or soft tissue abnormality. ABDOMEN AND PELVIS: LIVER: Multiple simple fluid attenuation cysts within the left and right hepatic lobes. All about normal. GALLBLADDER AND BILE DUCTS: Gallbladder is normal. No biliary ductal dilatation. SPLEEN: Spleen demonstrates no acute abnormality. PANCREAS: No evidence of pancreatitis. ADRENAL GLANDS: Adrenal glands demonstrate no acute abnormality. KIDNEYS, URETERS AND BLADDER: No stones in the kidneys or ureters. No hydronephrosis. No perinephric or periureteral stranding. Urinary bladder is unremarkable. GI AND BOWEL: The stomach is normal. There is submucosal edema involving the pylorus and 1st and 2nd portion of the duodenum (imaging 24 of series 4). Trace inflammatory stranding within the retroperitoneal fat along the second portion of the duodenum. Normal appendix. There is no bowel obstruction. No abnormal bowel wall thickening or distension. 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 PERITONEUM AND RETROPERITONEUM: No ascites or free air. LYMPH NODES: No lymphadenopathy. BONES AND SOFT TISSUES: No acute abnormality of the visualized bones. No focal soft tissue abnormality. IMPRESSION: 1. No evidence of acute pulmonary disease. No acute pulmonary parenchymal findings. 2. Aneurysmal dilatation of the ascending thoracic aorta to 4.5 cm. Recommend 38-month interval surveillance imaging. 3. Duodenitis involving the pylorus and first and second portions of the duodenum, with trace inflammatory stranding in the adjacent retroperitoneal  fat. No evidence of pancreatitis. Electronically signed by: Norleen Boxer MD 10/04/2024 12:32 PM EST RP Workstation: [id number]   CT ABDOMEN PELVIS W CONTRAST Result Date: 10/04/2024 EXAM: CTA CHEST PE WITH AND WITHOUT AND WITH CONTRAST CT ABDOMEN AND PELVIS WITH AND WITHOUT AND WITH CONTRAST 10/04/2024 11:38:14 AM TECHNIQUE: CTA of the chest was performed after the administration of intravenous contrast. Multiplanar reformatted images are provided for review. MIP images are provided for review. CT of the abdomen and pelvis was performed with and without the administration of intravenous contrast. Automated exposure control, iterative reconstruction, and/or weight based adjustment of the mA/kV was utilized to reduce  the radiation dose to as low as reasonably achievable. COMPARISON: None available. CLINICAL HISTORY: Pulmonary embolism (PE) suspected, low to intermediate prob, positive D-dimer. Epigastric pain. FINDINGS: CHEST: PULMONARY ARTERIES: Pulmonary arteries are adequately opacified for evaluation. No intraluminal filling defect to suggest pulmonary embolism. Main pulmonary artery is normal in caliber. MEDIASTINUM: Aneurysmal dilatation of the ascending thoracic aorta to 4.5 cm on coronal image 87 x 5. Aortic arch. No mediastinal lymphadenopathy. The heart and pericardium demonstrate no acute abnormality. LUNGS AND PLEURA: The lungs are without acute process. No pulmonary infarction. No pneumonia. No focal consolidation or pulmonary edema. No pleural fluid. No pneumothorax. SOFT TISSUES AND BONES: No acute bone or soft tissue abnormality. ABDOMEN AND PELVIS: LIVER: Multiple simple fluid attenuation cysts within the left and right hepatic lobes. All about normal. GALLBLADDER AND BILE DUCTS: Gallbladder is normal. No biliary ductal dilatation. SPLEEN: Spleen demonstrates no acute abnormality. PANCREAS: No evidence of pancreatitis. ADRENAL GLANDS: Adrenal glands demonstrate no acute abnormality. KIDNEYS,  URETERS AND BLADDER: No stones in the kidneys or ureters. No hydronephrosis. No perinephric or periureteral stranding. Urinary bladder is unremarkable. GI AND BOWEL: The stomach is normal. There is submucosal edema involving the pylorus and 1st and 2nd portion of the duodenum (imaging 24 of series 4). Trace inflammatory stranding within the retroperitoneal fat along the second portion of the duodenum. Normal appendix. There is no bowel obstruction. No abnormal bowel wall thickening or distension. 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 PERITONEUM AND RETROPERITONEUM: No ascites or free air. LYMPH NODES: No lymphadenopathy. BONES AND SOFT TISSUES: No acute abnormality of the visualized bones. No focal soft tissue abnormality. IMPRESSION: 1. No evidence of acute pulmonary disease. No acute pulmonary parenchymal findings. 2. Aneurysmal dilatation of the ascending thoracic aorta to 4.5 cm. Recommend 63-month interval surveillance imaging. 3. Duodenitis involving the pylorus and first and second portions of the duodenum, with trace inflammatory stranding in the adjacent retroperitoneal fat. No evidence of pancreatitis. Electronically signed by: Norleen Boxer MD 10/04/2024 12:32 PM EST RP Workstation: [id number]   DG Chest Port 1 View Result Date: 10/04/2024 CLINICAL DATA:  Chest pain. EXAM: PORTABLE CHEST 1 VIEW COMPARISON:  CTA chest dated 08/22/2018. FINDINGS: The heart size and mediastinal contours are within normal limits. No focal consolidation, pleural effusion, or pneumothorax. Dextroscoliotic curvature of the thoracic spine. IMPRESSION: No acute cardiopulmonary findings. Electronically Signed   By: Harrietta Sherry M.D.   On: 10/04/2024 10:51     Assessment and Plan: Epigastric pain: Patient described continuous epigastric pain from Monday until this morning.  Despite more than 3 days of continuous pain, serial troponin was 95-->81.  Symptom is accompanied by nausea.   CT of abdomen showed duodenitis.  CTA of the chest was negative for PE.  Suspicion for ACS fairly low.  Will obtain echocardiogram.  However if echo is negative, no further cardiac workup  Hyperlipidemia  Thoracic aortic aneurysm: Dilated thoracic aortic aneurysm measuring to 4.5 cm.  This is known to the patient, and followed by PCP.  Elevated D-dimer: CTA of the chest was negative for PE.    Risk Assessment/Risk Scores:              For questions or updates, please contact [facility] Please consult [email or web address] for contact info under    Signed, Lindy Pennisi, [place]  10/04/2024 7:56 PM

## 2024-10-04 NOTE — Progress Notes (Signed)
 Patient admitted to room with telemetry and provider notification. Denies pain and was oriented to room and call system.

## 2024-10-04 NOTE — ED Notes (Signed)
Spoke with lab to add on dimer

## 2024-10-04 NOTE — Progress Notes (Signed)
 PHARMACY - ANTICOAGULATION CONSULT NOTE  Pharmacy Consult for heparin Indication: chest pain/ACS  Allergies  Allergen Reactions   Penicillins Other (See Comments)    Other reaction(s): YEAST INFECTION    Patient Measurements: Height: 5' 4.5 (163.8 cm) Weight: 62.1 kg (137 lb) IBW/kg (Calculated) : 55.85 HEPARIN DW (KG): 62.1  Vital Signs: Temp: 98 F (36.7 C) (11/13 0941) Temp Source: Oral (11/13 0941) BP: 122/64 (11/13 1200) Pulse Rate: 59 (11/13 1200)  Labs: Recent Labs    10/04/24 0952  HGB 14.1  HCT 41.6  PLT 234  CREATININE 0.68    Estimated Creatinine Clearance: 58.6 mL/min (by C-G formula based on SCr of 0.68 mg/dL).   Medical History: History reviewed. No pertinent past medical history.  Medications:  Infusions:   heparin      Assessment: 56 yof presented to the ED with abdominal pain. D-dimer and troponin elevated. Starting IV heparin. Baseline CBC is WNL and she is not on anticoagulation PTA.   Goal of Therapy:  Heparin level 0.3-0.7 units/ml Monitor platelets by anticoagulation protocol: Yes   Plan:  Heparin bolus 3700 units IV x 1 Heparin gtt 800 units/hr Check a 6 hr heparin level Daily heparin level and CBC  Lachanda Buczek, Vernell Helling 10/04/2024,1:09 PM

## 2024-10-04 NOTE — ED Triage Notes (Signed)
 Patient reports epigastric pain worse at night with bloating. She says it is about 5/10 pain. Denies any abdominal history. Had some nausea on Saturday but subsided.

## 2024-10-04 NOTE — Hospital Course (Signed)
 Lindsey Cooper is a 68 y.o. female with medical history significant for HLD, ascending thoracic aortic aneurysm who is admitted with duodenitis.

## 2024-10-04 NOTE — ED Notes (Signed)
 Green, lavender, and blue sent to lab from triage.

## 2024-10-04 NOTE — Progress Notes (Deleted)
 Patient seen and examined, note reviewed with the signed Resident Physician/Advanced Practice Provider. I personally reviewed laboratory data, imaging studies and relevant notes. I independently examined the patient and formulated the important aspects of the plan. I have personally discussed the plan with the patient and/or family. Comments or changes to the note/plan are indicated below.  Exam:  General: Well nourished, well developed, in no acute distress HEENT: Supple, no JVD Cardiac: Normal S1, S2; RRR; no murmurs, rubs, or gallops Lungs: CTAB w/ no wheezing, rhonchi or rales  Abd: Soft, nontender, no hepatomegaly  Ext: No edema, pulses 2+ Skin: Warm and dry, no rashes   Neuro: AOA x 3  Assessment & Plan: Lindsey Cooper is a 49 yoF with Hx of HLD and ascending aorta aneurysm (4.5 cm) who presented with 4 days of constant epigastric pain w/ intermittent nausea. Labs with trop 95, 81 and D-dimer 1.31. CTA with no PE, duodenitis.ECG NSR.  #Epigastric pain #Troponin elevation  - Presenting with multiple days of epigastric pain. No CP or dyspnea. ECG w/o ischemic changes. No concern for ACS, so can stop heparin gtt. - Would recommend PPI +/- Maalox for duodenitis - Follow up with yearly imaging for aortic aneurysm  Signed, Lindsey Cooper. Ren Ny, MD [place]  [facility]  10/04/2024 7:51 PM

## 2024-10-04 NOTE — ED Notes (Signed)
 Pt  driving

## 2024-10-04 NOTE — ED Provider Notes (Signed)
 [place] EMERGENCY DEPARTMENT AT [facility] Provider Note   CSN: [id number] Arrival date & time: 10/04/24  9066     Patient presents with: Abdominal Pain   Lindsey Cooper is a 68 y.o. female.  {Add pertinent medical, surgical, social history, OB history to HPI:32947} 68 yo F with a chief complaints of abdominal pain.  This is to the epigastrium.  Has been going on for a few days now.  She says she did not feel very good on Saturday but seem to have improved by Sunday and then on Monday felt bad again.  Epigastric pain.  Has trouble describing it.  Denies radiation of the pain.  Seems to be worse when she is up and moving around and worse at the end of the day.  She denies trauma.  Denies fevers denies diarrhea.  Denies sick contacts.     Abdominal Pain      Prior to Admission medications   Medication Sig Start Date End Date Taking? Authorizing Provider  atorvastatin (LIPITOR) 40 MG tablet Take 1 tablet by mouth daily. 04/03/21   [provider]  lidocaine  23% - tetracaine  7% topical ointment, plasticized, Bring to office for use 08/15/23     rizatriptan (MAXALT) 10 MG tablet Take 1 tablet by mouth as needed. 12/10/16   [provider]    Allergies: Penicillins    Review of Systems  Gastrointestinal:  Positive for abdominal pain.    Updated Vital Signs BP 123/80 (BP Location: Right Arm)   Pulse 78   Temp 98 F (36.7 C) (Oral)   Resp 19   SpO2 98%   Physical Exam Vitals and nursing note reviewed.  Constitutional:      General: She is not in acute distress.    Appearance: She is well-developed. She is not diaphoretic.  HENT:     Head: Normocephalic and atraumatic.  Eyes:     Pupils: Pupils are equal, round, and reactive to light.  Cardiovascular:     Rate and Rhythm: Normal rate and regular rhythm.     Heart sounds: No murmur heard.    No friction rub. No gallop.  Pulmonary:     Effort: Pulmonary effort is normal.     Breath sounds:  No wheezing or rales.  Abdominal:     General: There is no distension.     Palpations: Abdomen is soft.     Tenderness: There is no abdominal tenderness.     Comments: Benign abdominal exam.  Musculoskeletal:        General: No tenderness.     Cervical back: Normal range of motion and neck supple.  Skin:    General: Skin is warm and dry.  Neurological:     Mental Status: She is alert and oriented to person, place, and time.  Psychiatric:        Behavior: Behavior normal.     (all labs ordered are listed, but only abnormal results are displayed) Labs Reviewed  CBC WITH DIFFERENTIAL/PLATELET  COMPREHENSIVE METABOLIC PANEL WITH GFR  LIPASE, BLOOD  TROPONIN T, HIGH SENSITIVITY    EKG: None  Radiology: No results found.  {Document cardiac monitor, telemetry assessment procedure when appropriate:32947} Procedures   Medications Ordered in the ED  sodium chloride 0.9 % bolus 1,000 mL (has no administration in time range)  morphine (PF) 4 MG/ML injection 4 mg (has no administration in time range)  ondansetron (ZOFRAN) injection 4 mg (has no administration in time range)      {  Click here for ABCD2, HEART and other calculators REFRESH Note before signing:1}                              Medical Decision Making Amount and/or Complexity of Data Reviewed Labs: ordered. Radiology: ordered. ECG/medicine tests: ordered.  Risk Prescription drug management.   68 yo F with a chief complaints of epigastric abdominal pain.  This has been going on for about 4 days now.  She does endorse some exertional component of it.  Sounds like it is otherwise worse at nights and may when she lays back flat.  Will obtain a troponin.  EKG chest x-ray.  Blood work.  Likely CT imaging.  {Document critical care time when appropriate  Document review of labs and clinical decision tools ie CHADS2VASC2, etc  Document your independent review of radiology images and any outside records  Document your  discussion with family members, caretakers and with consultants  Document social determinants of health affecting pt's care  Document your decision making why or why not admission, treatments were needed:32947:::1}   Final diagnoses:  None    ED Discharge Orders     None

## 2024-10-05 ENCOUNTER — Observation Stay (HOSPITAL_COMMUNITY)

## 2024-10-05 DIAGNOSIS — R1013 Epigastric pain: Secondary | ICD-10-CM | POA: Diagnosis not present

## 2024-10-05 DIAGNOSIS — R079 Chest pain, unspecified: Secondary | ICD-10-CM | POA: Diagnosis not present

## 2024-10-05 DIAGNOSIS — I7121 Aneurysm of the ascending aorta, without rupture: Secondary | ICD-10-CM | POA: Diagnosis not present

## 2024-10-05 DIAGNOSIS — E782 Mixed hyperlipidemia: Secondary | ICD-10-CM

## 2024-10-05 DIAGNOSIS — R7989 Other specified abnormal findings of blood chemistry: Secondary | ICD-10-CM | POA: Diagnosis not present

## 2024-10-05 DIAGNOSIS — K2980 Duodenitis without bleeding: Secondary | ICD-10-CM | POA: Diagnosis not present

## 2024-10-05 LAB — BASIC METABOLIC PANEL WITH GFR
Anion gap: 10 (ref 5–15)
BUN: 7 mg/dL — ABNORMAL LOW (ref 8–23)
CO2: 23 mmol/L (ref 22–32)
Calcium: 8.4 mg/dL — ABNORMAL LOW (ref 8.9–10.3)
Chloride: 108 mmol/L (ref 98–111)
Creatinine, Ser: 0.74 mg/dL (ref 0.44–1.00)
GFR, Estimated: >60 mL/min (ref >60)
Glucose, Bld: 93 mg/dL (ref 70–99)
Potassium: 4.1 mmol/L (ref 3.5–5.1)
Sodium: 141 mmol/L (ref 135–145)

## 2024-10-05 LAB — HIV ANTIBODY (ROUTINE TESTING W REFLEX): HIV Screen 4th Generation wRfx: NONREACTIVE

## 2024-10-05 LAB — CBC
HCT: 38.4 % (ref 36.0–46.0)
Hemoglobin: 12.7 g/dL (ref 12.0–15.0)
MCH: 32.2 pg (ref 26.0–34.0)
MCHC: 33.1 g/dL (ref 30.0–36.0)
MCV: 97.2 fL (ref 80.0–100.0)
Platelets: 204 K/uL (ref 150–400)
RBC: 3.95 MIL/uL (ref 3.87–5.11)
RDW: 11.9 % (ref 11.5–15.5)
WBC: 6.2 K/uL (ref 4.0–10.5)
nRBC: 0 % (ref 0.0–0.2)

## 2024-10-05 LAB — ECHOCARDIOGRAM COMPLETE
AV Vena cont: 0.4 cm
Area-P 1/2: 3.99 cm2
Height: 64.5 in
MV M vel: 4.18 m/s
MV Peak grad: 69.9 mmHg
S' Lateral: 2.7 cm
Single Plane A4C EF: 61.5 %
Weight: 2192 [oz_av]

## 2024-10-05 MED ORDER — PANTOPRAZOLE SODIUM 40 MG PO TBEC: 40.0000 mg | DELAYED_RELEASE_TABLET | Freq: Every day | ORAL | 0 refills | Status: AC

## 2024-10-05 NOTE — Progress Notes (Signed)
 PROGRESS NOTE   Lindsey Cooper  FMW:[id number]    DOB: 02/07/1956    DOA: 10/04/2024  PCP: Cleotilde Planas, MD   I have briefly reviewed patients previous medical records in [facility]   Brief Hospital Course:  68 year old female with medical history significant for HLD, ascending thoracic aortic aneurysm, presented to the ED with complaints of new onset epigastric abdominal pain with nausea and vomiting that started on 09/29/2024.  She thought that she might have caught a stomach bug.  She denied eating out or eating anything unusual.  She took some Pepto-Bismol and antacid with some mild relief.  She felt better the next day but by the day after, she had recurrent abdominal pain but no further nausea or vomiting.  She did have some dry heaves.  She reported normal BMs without diarrhea.  No fever, chills or diaphoresis.  Through the entire course, no chest pain, dyspnea or dizziness.   She was reportedly seen in the [facility] on 11/13 and she was sent to ED for further evaluation.  She denies any new medications or NSAID use.   Since hospital admission, her vital signs have been stable overall.  CMP and CBC were unremarkable.  Serial high-sensitivity troponin T mildly elevated with downward trend: 95 > 81.  D-dimer elevated at 1.31.  CTA chest PE protocol and CT A/P, showed no pulmonary embolism or acute disease.  Which showed aneurysmal dilatation of ascending thoracic aorta to 4.5 cm.  It also showed duodenitis involving the pylorus and 1st and 2nd portions of the duodenum, with trace inflammatory stranding in the adjacent retroperitoneal fat.  No evidence of pancreatitis.   In the ED she was treated with a liter of IV fluid bolus, maintenance IV fluids, briefly started on IV heparin drip, IV Protonix, IV Zofran, IV morphine.  Cardiology was consulted.   Assessment & Plan:   Epigastric pain/duodenitis: Etiology unclear.  No NSAID use, alcohol or tobacco use, eating anything unusual or  eating out recently.  Of note, she is on Fosamax-follow-up with PCP regarding continuation or holding temporarily.  She is asymptomatic of abdominal pain, nausea or vomiting today.  Continue Protonix 40 mg daily x 1 month, close outpatient follow-up with with PCP and could consider outpatient GI consultation if patient has recurrence of symptoms or do not resolve.   Elevated troponins: EKG showed nonspecific findings.  CTA negative for PE.  Cardiology was not concerned for ACS and stopped heparin drip in ED.  Discussed with Dr. Michele and his note from this morning appreciated.  Per their recommendation, if 2D echo is unremarkable, she can be discharged home and they will arrange outpatient follow-up to decide on workup as outpatient.  Addendum: 2D echo results reviewed, LVEF 45-50% with LV regional wall motion abnormalities.  Communicated with Dr. Michele, Cardiology who recommended that we keep the patient in-house until they see her again with recommendations tomorrow.  Discussed and updated patient with plans and she is agreeable.   Thoracic aortic aneurysm: Ascending aorta 4.5 cm.  As per recommendations made below, needs 27-month interval surveillance imaging.  Patient is aware of the diagnosis and indicates that her family physician follows this.  Continue statins.   Hyperlipidemia: Continue home dose of statins.  Body mass index is 23.15 kg/m.   DVT prophylaxis: enoxaparin (LOVENOX) injection 40 mg Start: 10/04/24 2000     Code Status: Full Code:  Family Communication: None at bedside. Disposition:  Status is: Observation The patient remains OBS appropriate  and will d/c before 2 midnights.     Consultants:     Procedures:     Subjective:  Seen this morning. Denies complaints. States that she feels great. Has been ambulating back and forth to the bathroom. No chest pain, abdominal pain, nausea, vomiting, dyspnea. Has tolerated p.o. intake.    Objective:   Vitals:   10/04/24 2324  10/05/24 0443 10/05/24 0844 10/05/24 1602  BP: 106/61 (!) 104/50 119/71 107/66  Pulse: 66 62 73 65  Resp: 18 18 16 16   Temp: 97.6 F (36.4 C) 99 F (37.2 C) (!) 97.4 F (36.3 C) 97.9 F (36.6 C)  TempSrc:   Oral Oral  SpO2: 98% 96% 98% 98%  Weight:      Height:        General: Middle-age female, moderately built and nourished lying comfortably propped up in bed without distress. Cardiovascular: S1 & S2 heard, RRR, S1/S2 +. No murmurs, rubs, gallops or clicks. No JVD or pedal edema.  Telemetry personally reviewed: Sinus rhythm. Respiratory: Clear to auscultation without wheezing, rhonchi or crackles. No increased work of breathing. Abdominal:  Non distended, non tender & soft. No organomegaly or masses appreciated. Normal bowel sounds heard. CNS: Alert and oriented. No focal deficits. Extremities: no edema, no cyanosis    Data Reviewed:   I have personally reviewed following labs and imaging studies   CBC: Recent Labs  Lab 10/04/24 0952 10/05/24 0237  WBC 7.0 6.2  NEUTROABS 4.7  --   HGB 14.1 12.7  HCT 41.6 38.4  MCV 94.8 97.2  PLT 234 204    Basic Metabolic Panel: Recent Labs  Lab 10/04/24 0952 10/05/24 0237  NA 140 141  K 4.4 4.1  CL 105 108  CO2 24 23  GLUCOSE 104* 93  BUN 10 7*  CREATININE 0.68 0.74  CALCIUM 9.9 8.4*    Liver Function Tests: Recent Labs  Lab 10/04/24 0952  AST 19  ALT 16  ALKPHOS 78  BILITOT 0.5  PROT 7.2  ALBUMIN 4.1    CBG: No results for input(s): GLUCAP in the last 168 hours.  Microbiology Studies:  No results found for this or any previous visit (from the past 240 hours).  Radiology Studies:  ECHOCARDIOGRAM COMPLETE Result Date: 10/05/2024    ECHOCARDIOGRAM REPORT   Patient Name:   Lindsey Cooper Date of Exam: 10/05/2024 Medical Rec #:  [id number]     Height:       64.5 in Accession #:    [id number]    Weight:       137.0 lb Date of Birth:  [birth year]    BSA:          1.675 m Patient Age:    67 years      BP:            119/71 mmHg Patient Gender: F             HR:           63 bpm. Exam Location:  Inpatient Procedure: 2D Echo, 3D Echo, Cardiac Doppler and Color Doppler (Both Spectral            and Color Flow Doppler were utilized during procedure). Indications:    Chest Pain R07.9  History:        Patient has no prior history of Echocardiogram examinations.                 Elevated troponin, hx of thoracic AA, Signs/Symptoms:Chest Pain;  Risk Factors:Dyslipidemia.  Sonographer:    Koleen Popper RDCS Referring Phys: [id number] VISHAL R PATEL IMPRESSIONS  1. Left ventricular ejection fraction, by estimation, is 45 to 50%. The left ventricle has mildly decreased function. The left ventricle demonstrates regional wall motion abnormalities (see scoring diagram/findings for description). The left ventricular  internal cavity size was mildly dilated. There is mild concentric left ventricular hypertrophy. Left ventricular diastolic parameters were normal.  2. Right ventricular systolic function is normal. The right ventricular size is normal. There is normal pulmonary artery systolic pressure.  3. A small pericardial effusion is present. The pericardial effusion is anterior to the right ventricle. There is no evidence of cardiac tamponade.  4. The mitral valve is degenerative. Trivial mitral valve regurgitation. No evidence of mitral stenosis.  5. The tricuspid valve is degenerative.  6. The aortic valve is tricuspid. Aortic valve regurgitation is moderate. Aortic valve sclerosis is present, with no evidence of aortic valve stenosis.  7. Aortic dilatation noted. There is moderate dilatation of the ascending aorta, measuring 44 mm.  8. The inferior vena cava is normal in size with greater than 50% respiratory variability, suggesting right atrial pressure of 3 mmHg. Comparison(s): No prior Echocardiogram. FINDINGS  Left Ventricle: Left ventricular ejection fraction, by estimation, is 45 to 50%. The left ventricle has  mildly decreased function. The left ventricle demonstrates regional wall motion abnormalities. 3D ejection fraction reviewed and evaluated as part of  the interpretation. Alternate measurement of EF is felt to be most reflective of LV function. The left ventricular internal cavity size was mildly dilated. There is mild concentric left ventricular hypertrophy. Left ventricular diastolic parameters were  normal.  LV Wall Scoring: The anterior septum is akinetic. The inferior septum and entire inferior wall are hypokinetic. Right Ventricle: The right ventricular size is normal. No increase in right ventricular wall thickness. Right ventricular systolic function is normal. There is normal pulmonary artery systolic pressure. The tricuspid regurgitant velocity is 2.28 m/s, and  with an assumed right atrial pressure of 3 mmHg, the estimated right ventricular systolic pressure is 23.8 mmHg. Left Atrium: Left atrial size was normal in size. Right Atrium: Right atrial size was not assessed. Pericardium: A small pericardial effusion is present. The pericardial effusion is anterior to the right ventricle. There is no evidence of cardiac tamponade. Mitral Valve: The mitral valve is degenerative in appearance. There is moderate thickening of the mitral valve leaflet(s). Trivial mitral valve regurgitation. No evidence of mitral valve stenosis. Tricuspid Valve: The tricuspid valve is degenerative in appearance. Tricuspid valve regurgitation is mild . No evidence of tricuspid stenosis. Aortic Valve: The aortic valve is tricuspid. Aortic valve regurgitation is moderate. PHT 430 ms. Aortic valve sclerosis is present, with no evidence of aortic valve stenosis. Pulmonic Valve: The pulmonic valve was not well visualized. Pulmonic valve regurgitation is not visualized. No evidence of pulmonic stenosis. Aorta: The aortic root is normal in size and structure and aortic dilatation noted. There is moderate dilatation of the ascending aorta,  measuring 44 mm. Venous: The inferior vena cava is normal in size with greater than 50% respiratory variability, suggesting right atrial pressure of 3 mmHg. IAS/Shunts: No atrial level shunt detected by color flow Doppler. Additional Comments: 3D was performed not requiring image post processing on an independent workstation and was normal.  LEFT VENTRICLE PLAX 2D LVIDd:         4.90 cm LVIDs:         2.70 cm LV PW:  1.10 cm LV IVS:        1.10 cm                             3D Volume EF:                             3D EF:        64 % LV Volumes (MOD) LV vol d, MOD A4C: 153.0 ml LV vol s, MOD A4C: 58.9 ml LV SV MOD A4C:     153.0 ml RIGHT VENTRICLE TAPSE (M-mode): 1.5 cm LEFT ATRIUM           Index LA diam:      3.83 cm 2.29 cm/m LA Vol (A2C): 39.7 ml 23.71 ml/m LA Vol (A4C): 32.2 ml 19.23 ml/m  AORTIC VALVE LVOT Vmax:         104.00 cm/s LVOT Vmean:        66.800 cm/s LVOT VTI:          0.210 m AR Vena Contracta: 0.40 cm MITRAL VALVE              TRICUSPID VALVE MV Area (PHT): 3.99 cm   TR Peak grad:   20.8 mmHg MV Decel Time: 190 msec   TR Vmax:        228.00 cm/s MR Peak grad: 69.9 mmHg MR Vmax:      418.00 cm/s SHUNTS                           Systemic VTI: 0.21 m Emeline Calender Electronically signed by Emeline Calender Signature Date/Time: 10/05/2024/3:56:13 PM    Final    CT Angio Chest PE W and/or Wo Contrast Result Date: 10/04/2024 EXAM: CTA CHEST PE WITH AND WITHOUT AND WITH CONTRAST CT ABDOMEN AND PELVIS WITH AND WITHOUT AND WITH CONTRAST 10/04/2024 11:38:14 AM TECHNIQUE: CTA of the chest was performed after the administration of intravenous contrast. Multiplanar reformatted images are provided for review. MIP images are provided for review. CT of the abdomen and pelvis was performed with and without the administration of intravenous contrast. Automated exposure control, iterative reconstruction, and/or weight based adjustment of the mA/kV was utilized to reduce the radiation dose to as low as  reasonably achievable. COMPARISON: None available. CLINICAL HISTORY: Pulmonary embolism (PE) suspected, low to intermediate prob, positive D-dimer. Epigastric pain. FINDINGS: CHEST: PULMONARY ARTERIES: Pulmonary arteries are adequately opacified for evaluation. No intraluminal filling defect to suggest pulmonary embolism. Main pulmonary artery is normal in caliber. MEDIASTINUM: Aneurysmal dilatation of the ascending thoracic aorta to 4.5 cm on coronal image 87 x 5. Aortic arch. No mediastinal lymphadenopathy. The heart and pericardium demonstrate no acute abnormality. LUNGS AND PLEURA: The lungs are without acute process. No pulmonary infarction. No pneumonia. No focal consolidation or pulmonary edema. No pleural fluid. No pneumothorax. SOFT TISSUES AND BONES: No acute bone or soft tissue abnormality. ABDOMEN AND PELVIS: LIVER: Multiple simple fluid attenuation cysts within the left and right hepatic lobes. All about normal. GALLBLADDER AND BILE DUCTS: Gallbladder is normal. No biliary ductal dilatation. SPLEEN: Spleen demonstrates no acute abnormality. PANCREAS: No evidence of pancreatitis. ADRENAL GLANDS: Adrenal glands demonstrate no acute abnormality. KIDNEYS, URETERS AND BLADDER: No stones in the kidneys or ureters. No hydronephrosis. No perinephric or periureteral stranding. Urinary bladder is unremarkable. GI AND BOWEL: The stomach is normal. There  is submucosal edema involving the pylorus and 1st and 2nd portion of the duodenum (imaging 24 of series 4). Trace inflammatory stranding within the retroperitoneal fat along the second portion of the duodenum. Normal appendix. There is no bowel obstruction. No abnormal bowel wall thickening or distension. 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 PERITONEUM AND RETROPERITONEUM: No ascites or free air. LYMPH NODES: No lymphadenopathy. BONES AND SOFT TISSUES: No acute abnormality of the visualized bones. No focal soft tissue  abnormality. IMPRESSION: 1. No evidence of acute pulmonary disease. No acute pulmonary parenchymal findings. 2. Aneurysmal dilatation of the ascending thoracic aorta to 4.5 cm. Recommend 20-month interval surveillance imaging. 3. Duodenitis involving the pylorus and first and second portions of the duodenum, with trace inflammatory stranding in the adjacent retroperitoneal fat. No evidence of pancreatitis. Electronically signed by: Norleen Boxer MD 10/04/2024 12:32 PM EST RP Workstation: [id number]   CT ABDOMEN PELVIS W CONTRAST Result Date: 10/04/2024 EXAM: CTA CHEST PE WITH AND WITHOUT AND WITH CONTRAST CT ABDOMEN AND PELVIS WITH AND WITHOUT AND WITH CONTRAST 10/04/2024 11:38:14 AM TECHNIQUE: CTA of the chest was performed after the administration of intravenous contrast. Multiplanar reformatted images are provided for review. MIP images are provided for review. CT of the abdomen and pelvis was performed with and without the administration of intravenous contrast. Automated exposure control, iterative reconstruction, and/or weight based adjustment of the mA/kV was utilized to reduce the radiation dose to as low as reasonably achievable. COMPARISON: None available. CLINICAL HISTORY: Pulmonary embolism (PE) suspected, low to intermediate prob, positive D-dimer. Epigastric pain. FINDINGS: CHEST: PULMONARY ARTERIES: Pulmonary arteries are adequately opacified for evaluation. No intraluminal filling defect to suggest pulmonary embolism. Main pulmonary artery is normal in caliber. MEDIASTINUM: Aneurysmal dilatation of the ascending thoracic aorta to 4.5 cm on coronal image 87 x 5. Aortic arch. No mediastinal lymphadenopathy. The heart and pericardium demonstrate no acute abnormality. LUNGS AND PLEURA: The lungs are without acute process. No pulmonary infarction. No pneumonia. No focal consolidation or pulmonary edema. No pleural fluid. No pneumothorax. SOFT TISSUES AND BONES: No acute bone or soft tissue abnormality.  ABDOMEN AND PELVIS: LIVER: Multiple simple fluid attenuation cysts within the left and right hepatic lobes. All about normal. GALLBLADDER AND BILE DUCTS: Gallbladder is normal. No biliary ductal dilatation. SPLEEN: Spleen demonstrates no acute abnormality. PANCREAS: No evidence of pancreatitis. ADRENAL GLANDS: Adrenal glands demonstrate no acute abnormality. KIDNEYS, URETERS AND BLADDER: No stones in the kidneys or ureters. No hydronephrosis. No perinephric or periureteral stranding. Urinary bladder is unremarkable. GI AND BOWEL: The stomach is normal. There is submucosal edema involving the pylorus and 1st and 2nd portion of the duodenum (imaging 24 of series 4). Trace inflammatory stranding within the retroperitoneal fat along the second portion of the duodenum. Normal appendix. There is no bowel obstruction. No abnormal bowel wall thickening or distension. 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 PERITONEUM AND RETROPERITONEUM: No ascites or free air. LYMPH NODES: No lymphadenopathy. BONES AND SOFT TISSUES: No acute abnormality of the visualized bones. No focal soft tissue abnormality. IMPRESSION: 1. No evidence of acute pulmonary disease. No acute pulmonary parenchymal findings. 2. Aneurysmal dilatation of the ascending thoracic aorta to 4.5 cm. Recommend 64-month interval surveillance imaging. 3. Duodenitis involving the pylorus and first and second portions of the duodenum, with trace inflammatory stranding in the adjacent retroperitoneal fat. No evidence of pancreatitis. Electronically signed by: Norleen Boxer MD 10/04/2024 12:32 PM EST RP Workstation: [id number]   DG Chest  Port 1 View Result Date: 10/04/2024 CLINICAL DATA:  Chest pain. EXAM: PORTABLE CHEST 1 VIEW COMPARISON:  CTA chest dated 08/22/2018. FINDINGS: The heart size and mediastinal contours are within normal limits. No focal consolidation, pleural effusion, or pneumothorax. Dextroscoliotic curvature of the  thoracic spine. IMPRESSION: No acute cardiopulmonary findings. Electronically Signed   By: Harrietta Sherry M.D.   On: 10/04/2024 10:51    Scheduled Meds:    amitriptyline  10 mg Oral QHS   atorvastatin  40 mg Oral Daily   enoxaparin (LOVENOX) injection  40 mg Subcutaneous Q24H   pantoprazole (PROTONIX) IV  40 mg Intravenous Q12H   sodium chloride flush  3 mL Intravenous Q12H    Continuous Infusions:     LOS: 0 days     Trenda Mar, MD,  FACP, [facility], [facility], [facility]   [facility] [place]      To contact the attending provider between 7A-7P or the covering provider during after hours 7P-7A, please log into the web site [email or web address] and access using universal [place] password for that web site. If you do not have the password, please call the hospital operator.  10/05/2024, 4:49 PM

## 2024-10-05 NOTE — Discharge Instructions (Addendum)
 Additional Discharge Instructions   Please get your medications reviewed and adjusted by your Primary MD.  Please request your Primary MD to go over all Hospital Tests and Procedure/Radiological results at the follow up, please get all Hospital records sent to your Primary MD by signing hospital release before you go home.  If you had Pneumonia of Lung problems at the Hospital: Please get a 2 view Chest X ray done in approximately 4 weeks after hospital discharge or sooner if instructed by your Primary MD.  If you have Congestive Heart Failure: Please call your Cardiologist or Primary MD anytime you have any of the following symptoms:  1) 3 pound weight gain in 24 hours or 5 pounds in 1 week  2) shortness of breath, with or without a dry hacking cough  3) swelling in the hands, feet or stomach  4) if you have to sleep on extra pillows at night in order to breathe  Follow cardiac low salt diet and 1.5 lit/day fluid restriction.  If you have diabetes Accuchecks 4 times/day, Once in AM empty stomach and then before each meal. Log in all results and show them to your primary doctor at your next visit. If any glucose reading is under 80 or above 300 call your primary MD immediately.  If you have Seizure/Convulsions/Epilepsy: Please do not drive, operate heavy machinery, participate in activities at heights or participate in high speed sports until you have seen by Primary MD or a Neurologist and advised to do so again. Per [place]  DMV statutes, patients with seizures are not allowed to drive until they have been seizure-free for six months.  Use caution when using heavy equipment or power tools. Avoid working on ladders or at heights. Take showers instead of baths. Ensure the water temperature is not too high on the home water heater. Do not go swimming alone. Do not lock yourself in a room alone (i.e. bathroom). When caring for infants or small children, sit down when holding, feeding,  or changing them to minimize risk of injury to the child in the event you have a seizure. Maintain good sleep hygiene. Avoid alcohol.   If you had Gastrointestinal Bleeding: Please ask your Primary MD to check a complete blood count within one week of discharge or at your next visit. Your endoscopic/colonoscopic biopsies that are pending at the time of discharge, will also need to followed by your Primary MD.  Get Medicines reviewed and adjusted. Please take all your medications with you for your next visit with your Primary MD  Please request your Primary MD to go over all hospital tests and procedure/radiological results at the follow up, please ask your Primary MD to get all Hospital records sent to his/her office.  If you experience worsening of your admission symptoms, develop shortness of breath, life threatening emergency, suicidal or homicidal thoughts you must seek medical attention immediately by calling 911 or calling your MD immediately  if symptoms less severe.  You must read complete instructions/literature along with all the possible adverse reactions/side effects for all the Medicines you take and that have been prescribed to you. Take any new Medicines after you have completely understood and accpet all the possible adverse reactions/side effects.   Do not drive or operate heavy machinery when taking Pain medications.   Do not take more than prescribed Pain, Sleep and Anxiety Medications  Special Instructions: If you have smoked or chewed Tobacco  in the last 2 yrs please stop smoking, stop any  regular Alcohol  and or any Recreational drug use.  Wear Seat belts while driving.  Please note You were cared for by a hospitalist during your hospital stay. If you have any questions about your discharge medications or the care you received while you were in the hospital after you are discharged, you can call the unit and asked to speak with the hospitalist on call if the hospitalist  that took care of you is not available. Once you are discharged, your primary care physician will handle any further medical issues. Please note that NO REFILLS for any discharge medications will be authorized once you are discharged, as it is imperative that you return to your primary care physician (or establish a relationship with a primary care physician if you do not have one) for your aftercare needs so that they can reassess your need for medications and monitor your lab values.  You can reach the hospitalist office at phone [phone] or fax [phone]   If you do not have a primary care physician, you can call [phone] for a physician referral.   .Drink plenty of fluids for 48 hours and keep wrist elevated at heart level for 24 hours  Radial Site Care   This sheet gives you information about how to care for yourself after your procedure. Your health care provider may also give you more specific instructions. If you have problems or questions, contact your health care provider. What can I expect after the procedure? After the procedure, it is common to have: Bruising and tenderness at the catheter insertion area. Follow these instructions at home: Medicines Take over-the-counter and prescription medicines only as told by your health care provider. Insertion site care Follow instructions from your health care provider about how to take care of your insertion site. Make sure you: Wash your hands with soap and water before you change your bandage (dressing). If soap and water are not available, use hand sanitizer. Remove your dressing as told by your health care provider. In 24 hours Check your insertion site every day for signs of infection. Check for: Redness, swelling, or pain. Fluid or blood. Pus or a bad smell. Warmth. Do not take baths, swim, or use a hot tub until your health care provider approves. You may shower 24-48 hours after the procedure, or as directed by your  health care provider. Remove the dressing and gently wash the site with plain soap and water. Pat the area dry with a clean towel. Do not rub the site. That could cause bleeding. Do not apply powder or lotion to the site. Activity   For 24 hours after the procedure, or as directed by your health care provider: Do not flex or bend the affected arm. Do not push or pull heavy objects with the affected arm. Do not drive yourself home from the hospital or clinic. You may drive 24 hours after the procedure unless your health care provider tells you not to. Do not operate machinery or power tools. Do not lift anything that is heavier than 10 lb (4.5 kg), or the limit that you are told, until your health care provider says that it is safe.  For 4 days Ask your health care provider when it is okay to: Return to work or school. Resume usual physical activities or sports. Resume sexual activity. General instructions If the catheter site starts to bleed, raise your arm and put firm pressure on the site. If the bleeding does not stop, get help right away.  This is a medical emergency. If you went home on the same day as your procedure, a responsible adult should be with you for the first 24 hours after you arrive home. Keep all follow-up visits as told by your health care provider. This is important. Contact a health care provider if: You have a fever. You have redness, swelling, or yellow drainage around your insertion site. Get help right away if: You have unusual pain at the radial site. The catheter insertion area swells very fast. The insertion area is bleeding, and the bleeding does not stop when you hold steady pressure on the area. Your arm or hand becomes pale, cool, tingly, or numb. These symptoms may represent a serious problem that is an emergency. Do not wait to see if the symptoms will go away. Get medical help right away. Call your local emergency services (911 in the [place] Do not  drive yourself to the hospital. Summary After the procedure, it is common to have bruising and tenderness at the site. Follow instructions from your health care provider about how to take care of your radial site wound. Check the wound every day for signs of infection. Do not lift anything that is heavier than 10 lb (4.5 kg), or the limit that you are told, until your health care provider says that it is safe. This information is not intended to replace advice given to you by your health care provider. Make sure you discuss any questions you have with your health care provider. Document Revised: 12/14/2017 Document Reviewed: 12/14/2017 Elsevier Patient Education  2020 [employer].

## 2024-10-05 NOTE — Progress Notes (Addendum)
 Rounding Note    Patient Name: Lindsey Cooper Date of Encounter: 10/05/2024  [facility] Cardiologist: None  Chief Complaint: Epigastric pain and nausea Reason of consult: Epigastric pain  Subjective   Patient seen and examined at bedside. Denies anginal chest pain or epigastric discomfort since arrival.  Inpatient Medications    Scheduled Meds:  amitriptyline  10 mg Oral QHS   atorvastatin  40 mg Oral Daily   enoxaparin (LOVENOX) injection  40 mg Subcutaneous Q24H   pantoprazole (PROTONIX) IV  40 mg Intravenous Q12H   sodium chloride flush  3 mL Intravenous Q12H   Continuous Infusions:  PRN Meds: acetaminophen **OR** acetaminophen, HYDROcodone-acetaminophen, morphine injection, ondansetron **OR** ondansetron (ZOFRAN) IV, senna-docusate   Vital Signs    Vitals:   10/04/24 1933 10/04/24 2324 10/05/24 0443 10/05/24 0844  BP: 124/63 106/61 (!) 104/50 119/71  Pulse: 74 66 62 73  Resp: 18 18 18 16   Temp: 97.9 F (36.6 C) 97.6 F (36.4 C) 99 F (37.2 C) (!) 97.4 F (36.3 C)  TempSrc:    Oral  SpO2: 96% 98% 96% 98%  Weight:      Height:        Intake/Output Summary (Last 24 hours) at 10/05/2024 0942 Last data filed at 10/04/2024 2128 Gross per 24 hour  Intake 1100 ml  Output --  Net 1100 ml      10/04/2024    1:00 PM 09/20/2023   10:08 AM  Last 3 Weights  Weight (lbs) 137 lb 135 lb 9.6 oz  Weight (kg) 62.143 kg 61.508 kg      Telemetry    Sinus rhythm without ectopy as per PT evaluation- Personally Reviewed  ECG    No new EKGs - Personally Reviewed  Physical Exam   Physical Exam  Constitutional: No distress.  hemodynamically stable  Neck: No JVD present.  Cardiovascular: Normal rate, regular rhythm, S1 normal and S2 normal. Exam reveals no gallop, no S3 and no S4.  No murmur heard. Pulmonary/Chest: Effort normal and breath sounds normal. No stridor. She has no wheezes. She has no rales.  Musculoskeletal:        General: No  edema.     Cervical back: Neck supple.  Skin: Skin is warm.    Labs    High Sensitivity Troponin:  No results for input(s): TROPONINIHS in the last 720 hours.   Chemistry Recent Labs  Lab 10/04/24 0952 10/05/24 0237  NA 140 141  K 4.4 4.1  CL 105 108  CO2 24 23  GLUCOSE 104* 93  BUN 10 7*  CREATININE 0.68 0.74  CALCIUM 9.9 8.4*  PROT 7.2  --   ALBUMIN 4.1  --   AST 19  --   ALT 16  --   ALKPHOS 78  --   BILITOT 0.5  --   GFRNONAA >60 >60  ANIONGAP 11 10    Lipids No results for input(s): CHOL, TRIG, HDL, LABVLDL, LDLCALC, CHOLHDL in the last 168 hours.  Hematology Recent Labs  Lab 10/04/24 0952 10/05/24 0237  WBC 7.0 6.2  RBC 4.39 3.95  HGB 14.1 12.7  HCT 41.6 38.4  MCV 94.8 97.2  MCH 32.1 32.2  MCHC 33.9 33.1  RDW 11.9 11.9  PLT 234 204   Thyroid  No results for input(s): TSH, FREET4 in the last 168 hours.  BNPNo results for input(s): BNP, PROBNP in the last 168 hours.  DDimer  Recent Labs  Lab 10/04/24 1009  DDIMER 1.31*  Radiology    CTA chest abdomen pelvis 10/04/2024 1. No evidence of acute pulmonary disease. No acute pulmonary parenchymal findings. 2. Aneurysmal dilatation of the ascending thoracic aorta to 4.5 cm. Recommend 57-month interval surveillance imaging. 3. Duodenitis involving the pylorus and first and second portions of the duodenum, with trace inflammatory stranding in the adjacent retroperitoneal fat. No evidence of pancreatitis.  Cardiac Studies   EKG: Sinus rhythm, 72 bpm, nonspecific ST-T changes  Echocardiogram pending  Patient Profile     68 y.o. female presents to the hospital during this admission with a chief complaint of epigastric discomfort.  Symptom onset 09/29/2024 when she started experiencing epigastric pain and nausea.  Symptoms improved the following day but resurfaced and therefore seek medical attention.  She was noted to have elevated troponin but essentially flat and elevated D-dimer.   Cardiology consulted for epigastric discomfort  Assessment & Plan    Epigastric pain Elevated troponins. Symptom onset 09/29/2024 Due to symptom progression and reoccurrence presents to the hospital for further evaluation High sensitive troponins elevated but downtrending 95 followed by 81 EKG shows normal sinus rhythm with nonspecific ST-T changes D-dimer elevated CT PE protocol negative for pulmonary embolism but CT abdomen concerning for duodenitis see results above Recommended echocardiogram to reevaluate LVEF. If LVEF is preserved would recommend conservative management for now and reevaluate ischemic workup as outpatient after her duodenitis has resolved. If LVEF is reduced or has regional wall motion normalities further recommendations forthcoming  Nausea Duodenitis CT findings of duodenitis involving the pylorus and the 1st and 2nd portion of the duodenum Management per primary team  History of thoracic aortic aneurysm. CT November 2025: Ascending thoracic aorta 45 mm Needs to be followed outpatient -recommend 49-month follow-up study Reemphasized importance of blood pressure management. Avoid heavy lifting  Hyperlipidemia: Continue lipid-lowering agents   Medical Decision Making:  Discussion of at least 2 chronic comorbid conditions. Complexity: medium - epigastric workup Interdisciplinary: Yes-spoke to attending physician after rounds.  Independently reviewed: EKG, CT imaging since arrival Will arrange outpatient follow-up.  For questions or updates, please contact [facility] Please consult [email or web address] for contact info under     Signed, Madonna Large, DO, [facility]  A Division of [facility] [address], [place], [place] [address]  Pager: [phone] Office: [phone] 10/05/2024, 9:42 AM

## 2024-10-05 NOTE — Discharge Summary (Incomplete)
 Physician Discharge Summary  Lindsey Cooper FMW:[id number] DOB: 1956/10/16  PCP: Cleotilde Planas, MD  Admitted from: Home Discharged to: Home  Admit date: 10/04/2024 Discharge date: 10/05/2024  Recommendations for Outpatient Follow-up:    Follow-up Information     Cleotilde Planas, MD. Schedule an appointment as soon as possible for a visit in 1 week(s).   Specialty: Family Medicine Why: To be seen with repeat labs (CBC & BMP). Contact information: [address] [place] [address] [phone]                  Home Health: None    Equipment/Devices: None    Discharge Condition: Improved and stable.   Code Status: Full Code Diet recommendation:  Discharge Diet Orders (From admission, onward)     Start     Ordered   10/05/24 0000  Diet - low sodium heart healthy        10/05/24 1200             Discharge Diagnoses:  Principal Problem:   Duodenitis Active Problems:   Thoracic aortic aneurysm without rupture   Elevated troponin   Hyperlipidemia   Epigastric pain   Brief Summary: 68 year old female with medical history significant for HLD, ascending thoracic aortic aneurysm, presented to the ED with complaints of new onset epigastric abdominal pain with nausea and vomiting that started on 09/29/2024.  She thought that she might have caught a stomach bug.  She denied eating out or eating anything unusual.  She took some Pepto-Bismol and antacid with some mild relief.  She felt better the next day but by the day after, she had recurrent abdominal pain but no further nausea or vomiting.  She did have some dry heaves.  She reported normal BMs without diarrhea.  No fever, chills or diaphoresis.  Through the entire course, no chest pain, dyspnea or dizziness.  She was reportedly seen in the [facility] on 11/13 and she was sent to ED for further evaluation.  She denies any new medications or NSAID use.  Since hospital admission, her vital signs have been  stable overall.  CMP and CBC were unremarkable.  Serial high-sensitivity troponin T mildly elevated with downward trend: 95 > 81.  D-dimer elevated at 1.31.  CTA chest PE protocol and CT A/P, showed no pulmonary embolism or acute disease.  Which showed aneurysmal dilatation of ascending thoracic aorta to 4.5 cm.  It also showed duodenitis involving the pylorus and 1st and 2nd portions of the duodenum, with trace inflammatory stranding in the adjacent retroperitoneal fat.  No evidence of pancreatitis.  In the ED she was treated with a liter of IV fluid bolus, maintenance IV fluids, briefly started on IV heparin drip, IV Protonix, IV Zofran, IV morphine.  Cardiology was consulted.  Assessment and plan:  Epigastric pain/duodenitis: Etiology unclear.  No NSAID use, alcohol or tobacco use, eating anything unusual or eating out recently.  Of note, she is on Fosamax-follow-up with PCP regarding continuation or holding temporarily.  She is asymptomatic of abdominal pain, nausea or vomiting today.  Continue Protonix 40 mg daily x 1 month, close outpatient follow-up with with PCP and could consider outpatient GI consultation if patient has recurrence of symptoms or do not resolve.  Elevated troponins: EKG showed nonspecific findings.  CTA negative for PE.  Cardiology was not concerned for ACS and stopped heparin drip in ED.  Discussed with Dr. Michele and his note from this morning appreciated.  Per their recommendation, if 2D  echo is unremarkable, she can be discharged home and they will arrange outpatient follow-up to decide on workup as outpatient.  Thoracic aortic aneurysm: Ascending aorta 4.5 cm.  As per recommendations made below, needs 9-month interval surveillance imaging.  Patient is aware of the diagnosis and indicates that her family physician follows this.  Continue statins.  Hyperlipidemia: Continue home dose of statins.   Consultations: Cardiology  Procedures: None   Discharge  Instructions  Discharge Instructions     Activity as tolerated - No restrictions   Complete by: As directed    Call MD for:  difficulty breathing, headache or visual disturbances   Complete by: As directed    Call MD for:  extreme fatigue   Complete by: As directed    Call MD for:  persistant dizziness or light-headedness   Complete by: As directed    Call MD for:  persistant nausea and vomiting   Complete by: As directed    Call MD for:  severe uncontrolled pain   Complete by: As directed    Diet - low sodium heart healthy   Complete by: As directed         Medication List     TAKE these medications    amitriptyline 10 MG tablet Commonly known as: ELAVIL Take 10 mg by mouth at bedtime.   atorvastatin 40 MG tablet Commonly known as: LIPITOR Take 1 tablet by mouth daily.   estradiol 0.1 MG/GM vaginal cream Commonly known as: ESTRACE Place 1 Applicatorful vaginally as needed.   Fosamax 70 MG tablet Generic drug: alendronate Take 70 mg by mouth once a week.   Hair Skin & Nails Tabs Take 1 tablet by mouth daily.   Multi For Her 50+ Tabs Take 1 tablet by mouth daily.   OVER THE COUNTER MEDICATION Take 1 tablet by mouth daily. *vitamin d po, vitamin c po, calcium po, collagen po   pantoprazole 40 MG tablet Commonly known as: Protonix Take 1 tablet (40 mg total) by mouth daily.   rizatriptan 10 MG tablet Commonly known as: MAXALT Take 1 tablet by mouth as needed.       Allergies  Allergen Reactions   Penicillins Other (See Comments)    Other reaction(s): YEAST INFECTION      Procedures/Studies: CT Angio Chest PE W and/or Wo Contrast Result Date: 10/04/2024 EXAM: CTA CHEST PE WITH AND WITHOUT AND WITH CONTRAST CT ABDOMEN AND PELVIS WITH AND WITHOUT AND WITH CONTRAST 10/04/2024 11:38:14 AM TECHNIQUE: CTA of the chest was performed after the administration of intravenous contrast. Multiplanar reformatted images are provided for review. MIP images are  provided for review. CT of the abdomen and pelvis was performed with and without the administration of intravenous contrast. Automated exposure control, iterative reconstruction, and/or weight based adjustment of the mA/kV was utilized to reduce the radiation dose to as low as reasonably achievable. COMPARISON: None available. CLINICAL HISTORY: Pulmonary embolism (PE) suspected, low to intermediate prob, positive D-dimer. Epigastric pain. FINDINGS: CHEST: PULMONARY ARTERIES: Pulmonary arteries are adequately opacified for evaluation. No intraluminal filling defect to suggest pulmonary embolism. Main pulmonary artery is normal in caliber. MEDIASTINUM: Aneurysmal dilatation of the ascending thoracic aorta to 4.5 cm on coronal image 87 x 5. Aortic arch. No mediastinal lymphadenopathy. The heart and pericardium demonstrate no acute abnormality. LUNGS AND PLEURA: The lungs are without acute process. No pulmonary infarction. No pneumonia. No focal consolidation or pulmonary edema. No pleural fluid. No pneumothorax. SOFT TISSUES AND BONES: No acute bone or  soft tissue abnormality. ABDOMEN AND PELVIS: LIVER: Multiple simple fluid attenuation cysts within the left and right hepatic lobes. All about normal. GALLBLADDER AND BILE DUCTS: Gallbladder is normal. No biliary ductal dilatation. SPLEEN: Spleen demonstrates no acute abnormality. PANCREAS: No evidence of pancreatitis. ADRENAL GLANDS: Adrenal glands demonstrate no acute abnormality. KIDNEYS, URETERS AND BLADDER: No stones in the kidneys or ureters. No hydronephrosis. No perinephric or periureteral stranding. Urinary bladder is unremarkable. GI AND BOWEL: The stomach is normal. There is submucosal edema involving the pylorus and 1st and 2nd portion of the duodenum (imaging 24 of series 4). Trace inflammatory stranding within the retroperitoneal fat along the second portion of the duodenum. Normal appendix. There is no bowel obstruction. No abnormal bowel wall thickening  or distension. 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 PERITONEUM AND RETROPERITONEUM: No ascites or free air. LYMPH NODES: No lymphadenopathy. BONES AND SOFT TISSUES: No acute abnormality of the visualized bones. No focal soft tissue abnormality. IMPRESSION: 1. No evidence of acute pulmonary disease. No acute pulmonary parenchymal findings. 2. Aneurysmal dilatation of the ascending thoracic aorta to 4.5 cm. Recommend 22-month interval surveillance imaging. 3. Duodenitis involving the pylorus and first and second portions of the duodenum, with trace inflammatory stranding in the adjacent retroperitoneal fat. No evidence of pancreatitis. Electronically signed by: Norleen Boxer MD 10/04/2024 12:32 PM EST RP Workstation: [id number]   CT ABDOMEN PELVIS W CONTRAST Result Date: 10/04/2024 EXAM: CTA CHEST PE WITH AND WITHOUT AND WITH CONTRAST CT ABDOMEN AND PELVIS WITH AND WITHOUT AND WITH CONTRAST 10/04/2024 11:38:14 AM TECHNIQUE: CTA of the chest was performed after the administration of intravenous contrast. Multiplanar reformatted images are provided for review. MIP images are provided for review. CT of the abdomen and pelvis was performed with and without the administration of intravenous contrast. Automated exposure control, iterative reconstruction, and/or weight based adjustment of the mA/kV was utilized to reduce the radiation dose to as low as reasonably achievable. COMPARISON: None available. CLINICAL HISTORY: Pulmonary embolism (PE) suspected, low to intermediate prob, positive D-dimer. Epigastric pain. FINDINGS: CHEST: PULMONARY ARTERIES: Pulmonary arteries are adequately opacified for evaluation. No intraluminal filling defect to suggest pulmonary embolism. Main pulmonary artery is normal in caliber. MEDIASTINUM: Aneurysmal dilatation of the ascending thoracic aorta to 4.5 cm on coronal image 87 x 5. Aortic arch. No mediastinal lymphadenopathy. The heart and pericardium  demonstrate no acute abnormality. LUNGS AND PLEURA: The lungs are without acute process. No pulmonary infarction. No pneumonia. No focal consolidation or pulmonary edema. No pleural fluid. No pneumothorax. SOFT TISSUES AND BONES: No acute bone or soft tissue abnormality. ABDOMEN AND PELVIS: LIVER: Multiple simple fluid attenuation cysts within the left and right hepatic lobes. All about normal. GALLBLADDER AND BILE DUCTS: Gallbladder is normal. No biliary ductal dilatation. SPLEEN: Spleen demonstrates no acute abnormality. PANCREAS: No evidence of pancreatitis. ADRENAL GLANDS: Adrenal glands demonstrate no acute abnormality. KIDNEYS, URETERS AND BLADDER: No stones in the kidneys or ureters. No hydronephrosis. No perinephric or periureteral stranding. Urinary bladder is unremarkable. GI AND BOWEL: The stomach is normal. There is submucosal edema involving the pylorus and 1st and 2nd portion of the duodenum (imaging 24 of series 4). Trace inflammatory stranding within the retroperitoneal fat along the second portion of the duodenum. Normal appendix. There is no bowel obstruction. No abnormal bowel wall thickening or distension. 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 PERITONEUM AND RETROPERITONEUM: No ascites or free air. LYMPH NODES: No lymphadenopathy. BONES AND SOFT TISSUES: No acute  abnormality of the visualized bones. No focal soft tissue abnormality. IMPRESSION: 1. No evidence of acute pulmonary disease. No acute pulmonary parenchymal findings. 2. Aneurysmal dilatation of the ascending thoracic aorta to 4.5 cm. Recommend 63-month interval surveillance imaging. 3. Duodenitis involving the pylorus and first and second portions of the duodenum, with trace inflammatory stranding in the adjacent retroperitoneal fat. No evidence of pancreatitis. Electronically signed by: Norleen Boxer MD 10/04/2024 12:32 PM EST RP Workstation: [id number]   DG Chest Port 1 View Result Date:  10/04/2024 CLINICAL DATA:  Chest pain. EXAM: PORTABLE CHEST 1 VIEW COMPARISON:  CTA chest dated 08/22/2018. FINDINGS: The heart size and mediastinal contours are within normal limits. No focal consolidation, pleural effusion, or pneumothorax. Dextroscoliotic curvature of the thoracic spine. IMPRESSION: No acute cardiopulmonary findings. Electronically Signed   By: Harrietta Sherry M.D.   On: 10/04/2024 10:51      Subjective: Seen this morning.  Denies complaints.  States that she feels great.  Has been ambulating back and forth to the bathroom.  No chest pain, abdominal pain, nausea, vomiting, dyspnea.  Has tolerated p.o. intake.  Discharge Exam:  Vitals:   10/04/24 1933 10/04/24 2324 10/05/24 0443 10/05/24 0844  BP: 124/63 106/61 (!) 104/50 119/71  Pulse: 74 66 62 73  Resp: 18 18 18 16   Temp: 97.9 F (36.6 C) 97.6 F (36.4 C) 99 F (37.2 C) (!) 97.4 F (36.3 C)  TempSrc:    Oral  SpO2: 96% 98% 96% 98%  Weight:      Height:        General: Middle-age female, moderately built and nourished lying comfortably propped up in bed without distress. Cardiovascular: S1 & S2 heard, RRR, S1/S2 +. No murmurs, rubs, gallops or clicks. No JVD or pedal edema.  Telemetry personally reviewed: Sinus rhythm. Respiratory: Clear to auscultation without wheezing, rhonchi or crackles. No increased work of breathing. Abdominal:  Non distended, non tender & soft. No organomegaly or masses appreciated. Normal bowel sounds heard. CNS: Alert and oriented. No focal deficits. Extremities: no edema, no cyanosis    The results of significant diagnostics from this hospitalization (including imaging, microbiology, ancillary and laboratory) are listed below for reference.     Microbiology: No results found for this or any previous visit (from the past 240 hours).   Labs: CBC: Recent Labs  Lab 10/04/24 0952 10/05/24 0237  WBC 7.0 6.2  NEUTROABS 4.7  --   HGB 14.1 12.7  HCT 41.6 38.4  MCV 94.8 97.2   PLT 234 204    Basic Metabolic Panel: Recent Labs  Lab 10/04/24 0952 10/05/24 0237  NA 140 141  K 4.4 4.1  CL 105 108  CO2 24 23  GLUCOSE 104* 93  BUN 10 7*  CREATININE 0.68 0.74  CALCIUM 9.9 8.4*    Liver Function Tests: Recent Labs  Lab 10/04/24 0952  AST 19  ALT 16  ALKPHOS 78  BILITOT 0.5  PROT 7.2  ALBUMIN 4.1       Time coordinating discharge: 25 minutes  SIGNED:  Trenda Mar, MD,  FACP, [facility], [facility], [facility]   [facility] [place]     To contact the attending provider between 7A-7P or the covering provider during after hours 7P-7A, please log into the web site [email or web address] and access using universal [place] password for that web site. If you do not have the password, please call the hospital operator.

## 2024-10-05 NOTE — Progress Notes (Signed)
  Echocardiogram 2D Echocardiogram has been performed.  Koleen KANDICE Popper, RDCS 10/05/2024, 1:30 PM

## 2024-10-05 NOTE — Care Management Obs Status (Signed)
 MEDICARE OBSERVATION STATUS NOTIFICATION   Patient Details  Name: Lindsey Cooper MRN: [id number] Date of Birth: August 25, 1956   Medicare Observation Status Notification Given:  Yes    Bridget Cordella Simmonds, LCSW 10/05/2024, 1:51 PM

## 2024-10-06 DIAGNOSIS — F419 Anxiety disorder, unspecified: Secondary | ICD-10-CM | POA: Diagnosis present

## 2024-10-06 DIAGNOSIS — K2980 Duodenitis without bleeding: Secondary | ICD-10-CM | POA: Diagnosis present

## 2024-10-06 DIAGNOSIS — Z8249 Family history of ischemic heart disease and other diseases of the circulatory system: Secondary | ICD-10-CM | POA: Diagnosis not present

## 2024-10-06 DIAGNOSIS — E785 Hyperlipidemia, unspecified: Secondary | ICD-10-CM | POA: Diagnosis present

## 2024-10-06 DIAGNOSIS — Z79899 Other long term (current) drug therapy: Secondary | ICD-10-CM | POA: Diagnosis not present

## 2024-10-06 DIAGNOSIS — R7989 Other specified abnormal findings of blood chemistry: Secondary | ICD-10-CM | POA: Diagnosis not present

## 2024-10-06 DIAGNOSIS — R946 Abnormal results of thyroid function studies: Secondary | ICD-10-CM | POA: Diagnosis present

## 2024-10-06 DIAGNOSIS — I7121 Aneurysm of the ascending aorta, without rupture: Secondary | ICD-10-CM | POA: Diagnosis present

## 2024-10-06 DIAGNOSIS — I214 Non-ST elevation (NSTEMI) myocardial infarction: Secondary | ICD-10-CM

## 2024-10-06 DIAGNOSIS — I2510 Atherosclerotic heart disease of native coronary artery without angina pectoris: Secondary | ICD-10-CM | POA: Diagnosis present

## 2024-10-06 DIAGNOSIS — I25110 Atherosclerotic heart disease of native coronary artery with unstable angina pectoris: Secondary | ICD-10-CM | POA: Diagnosis not present

## 2024-10-06 DIAGNOSIS — I2489 Other forms of acute ischemic heart disease: Secondary | ICD-10-CM | POA: Diagnosis present

## 2024-10-06 DIAGNOSIS — Z79890 Hormone replacement therapy (postmenopausal): Secondary | ICD-10-CM | POA: Diagnosis not present

## 2024-10-06 DIAGNOSIS — I429 Cardiomyopathy, unspecified: Secondary | ICD-10-CM | POA: Diagnosis present

## 2024-10-06 DIAGNOSIS — Z880 Allergy status to penicillin: Secondary | ICD-10-CM | POA: Diagnosis not present

## 2024-10-06 DIAGNOSIS — Z7983 Long term (current) use of bisphosphonates: Secondary | ICD-10-CM | POA: Diagnosis not present

## 2024-10-06 DIAGNOSIS — Z7982 Long term (current) use of aspirin: Secondary | ICD-10-CM | POA: Diagnosis not present

## 2024-10-06 DIAGNOSIS — E876 Hypokalemia: Secondary | ICD-10-CM | POA: Diagnosis present

## 2024-10-06 LAB — CBC
HCT: 41.8 % (ref 36.0–46.0)
Hemoglobin: 14.2 g/dL (ref 12.0–15.0)
MCH: 31.9 pg (ref 26.0–34.0)
MCHC: 34 g/dL (ref 30.0–36.0)
MCV: 93.9 fL (ref 80.0–100.0)
Platelets: 240 K/uL (ref 150–400)
RBC: 4.45 MIL/uL (ref 3.87–5.11)
RDW: 11.8 % (ref 11.5–15.5)
WBC: 6.6 K/uL (ref 4.0–10.5)
nRBC: 0 % (ref 0.0–0.2)

## 2024-10-06 LAB — BRAIN NATRIURETIC PEPTIDE: B Natriuretic Peptide: 141.9 pg/mL — ABNORMAL HIGH (ref 0.0–100.0)

## 2024-10-06 LAB — TROPONIN I (HIGH SENSITIVITY): Troponin I (High Sensitivity): 33 ng/L — ABNORMAL HIGH (ref <18)

## 2024-10-06 LAB — HEPARIN LEVEL (UNFRACTIONATED): Heparin Unfractionated: 0.98 [IU]/mL — ABNORMAL HIGH (ref 0.30–0.70)

## 2024-10-06 MED ORDER — METOPROLOL TARTRATE 12.5 MG HALF TABLET
12.5000 mg | ORAL_TABLET | Freq: Two times a day (BID) | ORAL | Status: DC
  Administered 2024-10-06 – 2024-10-08 (×4): 12.5 mg via ORAL
  Filled 2024-10-06 (×4): qty 1

## 2024-10-06 MED ORDER — ASPIRIN 81 MG PO TBEC
81.0000 mg | DELAYED_RELEASE_TABLET | Freq: Every day | ORAL | Status: DC
  Administered 2024-10-06 – 2024-10-08 (×3): 81 mg via ORAL
  Filled 2024-10-06 (×4): qty 1

## 2024-10-06 MED ORDER — HEPARIN BOLUS VIA INFUSION
3700.0000 [IU] | Freq: Once | INTRAVENOUS | Status: AC
  Administered 2024-10-06: 3700 [IU] via INTRAVENOUS
  Filled 2024-10-06: qty 3700

## 2024-10-06 MED ORDER — HEPARIN (PORCINE) 25000 UT/250ML-% IV SOLN
650.0000 [IU]/h | INTRAVENOUS | Status: DC
  Administered 2024-10-06: 750 [IU]/h via INTRAVENOUS
  Administered 2024-10-08: 650 [IU]/h via INTRAVENOUS
  Filled 2024-10-06 (×2): qty 250

## 2024-10-06 MED ORDER — AMITRIPTYLINE HCL 10 MG PO TABS
10.0000 mg | ORAL_TABLET | Freq: Every day | ORAL | Status: DC
  Administered 2024-10-07: 10 mg via ORAL
  Filled 2024-10-06 (×3): qty 1

## 2024-10-06 NOTE — Progress Notes (Addendum)
 Rounding Note   Patient Name: Lindsey Cooper Date of Encounter: 10/06/2024  [facility] Cardiologist: None   Subjective - No acute events overnight - Patient has no complaints denying chest pain, SOB, nausea, vomiting  Scheduled Meds:  amitriptyline  10 mg Oral QHS   atorvastatin  40 mg Oral Daily   enoxaparin (LOVENOX) injection  40 mg Subcutaneous Q24H   pantoprazole (PROTONIX) IV  40 mg Intravenous Q12H   sodium chloride flush  3 mL Intravenous Q12H   Continuous Infusions:  PRN Meds: acetaminophen **OR** acetaminophen, HYDROcodone-acetaminophen, morphine injection, ondansetron **OR** ondansetron (ZOFRAN) IV, senna-docusate   Vital Signs  Vitals:   10/05/24 1602 10/05/24 1945 10/06/24 0421 10/06/24 0826  BP: 107/66 117/65 128/65 133/78  Pulse: 65 66 66 73  Resp: 16 15 17 16   Temp: 97.9 F (36.6 C) 98.8 F (37.1 C) 98.1 F (36.7 C) 97.7 F (36.5 C)  TempSrc: Oral Oral  Oral  SpO2: 98% 98% 90% 97%  Weight:      Height:        Intake/Output Summary (Last 24 hours) at 10/06/2024 1005 Last data filed at 10/06/2024 0826 Gross per 24 hour  Intake 240 ml  Output --  Net 240 ml      10/04/2024    1:00 PM 09/20/2023   10:08 AM  Last 3 Weights  Weight (lbs) 137 lb 135 lb 9.6 oz  Weight (kg) 62.143 kg 61.508 kg      Telemetry No telemetry  ECG  No new ECG- Personally Reviewed  Physical Exam  GEN: No acute distress.   Neck: No JVD Cardiac: RRR, no murmurs, rubs, or gallops.  Respiratory: Clear to auscultation bilaterally. GI: Soft, nontender, non-distended  MS: No edema; No deformity. Neuro:  Nonfocal  Psych: Normal affect   Labs High Sensitivity Troponin:  No results for input(s): TROPONINIHS in the last 720 hours.   Chemistry Recent Labs  Lab 10/04/24 0952 10/05/24 0237  NA 140 141  K 4.4 4.1  CL 105 108  CO2 24 23  GLUCOSE 104* 93  BUN 10 7*  CREATININE 0.68 0.74  CALCIUM 9.9 8.4*  PROT 7.2  --   ALBUMIN 4.1  --   AST 19   --   ALT 16  --   ALKPHOS 78  --   BILITOT 0.5  --   GFRNONAA >60 >60  ANIONGAP 11 10    Lipids No results for input(s): CHOL, TRIG, HDL, LABVLDL, LDLCALC, CHOLHDL in the last 168 hours.  Hematology Recent Labs  Lab 10/04/24 0952 10/05/24 0237  WBC 7.0 6.2  RBC 4.39 3.95  HGB 14.1 12.7  HCT 41.6 38.4  MCV 94.8 97.2  MCH 32.1 32.2  MCHC 33.9 33.1  RDW 11.9 11.9  PLT 234 204   Thyroid  No results for input(s): TSH, FREET4 in the last 168 hours.  BNPNo results for input(s): BNP, PROBNP in the last 168 hours.  DDimer  Recent Labs  Lab 10/04/24 1009  DDIMER 1.31*     Radiology  ECHOCARDIOGRAM COMPLETE Result Date: 10/05/2024    ECHOCARDIOGRAM REPORT   Patient Name:   Lindsey Cooper Date of Exam: 10/05/2024 Medical Rec #:  [id number]     Height:       64.5 in Accession #:    [id number]    Weight:       137.0 lb Date of Birth:  1956/08/14    BSA:          1.675 m Patient  Age:    68 years      BP:           119/71 mmHg Patient Gender: F             HR:           63 bpm. Exam Location:  Inpatient Procedure: 2D Echo, 3D Echo, Cardiac Doppler and Color Doppler (Both Spectral            and Color Flow Doppler were utilized during procedure). Indications:    Chest Pain R07.9  History:        Patient has no prior history of Echocardiogram examinations.                 Elevated troponin, hx of thoracic AA, Signs/Symptoms:Chest Pain;                 Risk Factors:Dyslipidemia.  Sonographer:    Koleen Popper RDCS Referring Phys: [id number] VISHAL R PATEL IMPRESSIONS  1. Left ventricular ejection fraction, by estimation, is 45 to 50%. The left ventricle has mildly decreased function. The left ventricle demonstrates regional wall motion abnormalities (see scoring diagram/findings for description). The left ventricular  internal cavity size was mildly dilated. There is mild concentric left ventricular hypertrophy. Left ventricular diastolic parameters were normal.  2. Right ventricular  systolic function is normal. The right ventricular size is normal. There is normal pulmonary artery systolic pressure.  3. A small pericardial effusion is present. The pericardial effusion is anterior to the right ventricle. There is no evidence of cardiac tamponade.  4. The mitral valve is degenerative. Trivial mitral valve regurgitation. No evidence of mitral stenosis.  5. The tricuspid valve is degenerative.  6. The aortic valve is tricuspid. Aortic valve regurgitation is moderate. Aortic valve sclerosis is present, with no evidence of aortic valve stenosis.  7. Aortic dilatation noted. There is moderate dilatation of the ascending aorta, measuring 44 mm.  8. The inferior vena cava is normal in size with greater than 50% respiratory variability, suggesting right atrial pressure of 3 mmHg. Comparison(s): No prior Echocardiogram. FINDINGS  Left Ventricle: Left ventricular ejection fraction, by estimation, is 45 to 50%. The left ventricle has mildly decreased function. The left ventricle demonstrates regional wall motion abnormalities. 3D ejection fraction reviewed and evaluated as part of  the interpretation. Alternate measurement of EF is felt to be most reflective of LV function. The left ventricular internal cavity size was mildly dilated. There is mild concentric left ventricular hypertrophy. Left ventricular diastolic parameters were  normal.  LV Wall Scoring: The anterior septum is akinetic. The inferior septum and entire inferior wall are hypokinetic. Right Ventricle: The right ventricular size is normal. No increase in right ventricular wall thickness. Right ventricular systolic function is normal. There is normal pulmonary artery systolic pressure. The tricuspid regurgitant velocity is 2.28 m/s, and  with an assumed right atrial pressure of 3 mmHg, the estimated right ventricular systolic pressure is 23.8 mmHg. Left Atrium: Left atrial size was normal in size. Right Atrium: Right atrial size was not  assessed. Pericardium: A small pericardial effusion is present. The pericardial effusion is anterior to the right ventricle. There is no evidence of cardiac tamponade. Mitral Valve: The mitral valve is degenerative in appearance. There is moderate thickening of the mitral valve leaflet(s). Trivial mitral valve regurgitation. No evidence of mitral valve stenosis. Tricuspid Valve: The tricuspid valve is degenerative in appearance. Tricuspid valve regurgitation is mild . No evidence of tricuspid stenosis. Aortic Valve:  The aortic valve is tricuspid. Aortic valve regurgitation is moderate. PHT 430 ms. Aortic valve sclerosis is present, with no evidence of aortic valve stenosis. Pulmonic Valve: The pulmonic valve was not well visualized. Pulmonic valve regurgitation is not visualized. No evidence of pulmonic stenosis. Aorta: The aortic root is normal in size and structure and aortic dilatation noted. There is moderate dilatation of the ascending aorta, measuring 44 mm. Venous: The inferior vena cava is normal in size with greater than 50% respiratory variability, suggesting right atrial pressure of 3 mmHg. IAS/Shunts: No atrial level shunt detected by color flow Doppler. Additional Comments: 3D was performed not requiring image post processing on an independent workstation and was normal.  LEFT VENTRICLE PLAX 2D LVIDd:         4.90 cm LVIDs:         2.70 cm LV PW:         1.10 cm LV IVS:        1.10 cm                             3D Volume EF:                             3D EF:        64 % LV Volumes (MOD) LV vol d, MOD A4C: 153.0 ml LV vol s, MOD A4C: 58.9 ml LV SV MOD A4C:     153.0 ml RIGHT VENTRICLE TAPSE (M-mode): 1.5 cm LEFT ATRIUM           Index LA diam:      3.83 cm 2.29 cm/m LA Vol (A2C): 39.7 ml 23.71 ml/m LA Vol (A4C): 32.2 ml 19.23 ml/m  AORTIC VALVE LVOT Vmax:         104.00 cm/s LVOT Vmean:        66.800 cm/s LVOT VTI:          0.210 m AR Vena Contracta: 0.40 cm MITRAL VALVE              TRICUSPID VALVE  MV Area (PHT): 3.99 cm   TR Peak grad:   20.8 mmHg MV Decel Time: 190 msec   TR Vmax:        228.00 cm/s MR Peak grad: 69.9 mmHg MR Vmax:      418.00 cm/s SHUNTS                           Systemic VTI: 0.21 m Emeline Calender Electronically signed by Emeline Calender Signature Date/Time: 10/05/2024/3:56:13 PM    Final    CT Angio Chest PE W and/or Wo Contrast Result Date: 10/04/2024 EXAM: CTA CHEST PE WITH AND WITHOUT AND WITH CONTRAST CT ABDOMEN AND PELVIS WITH AND WITHOUT AND WITH CONTRAST 10/04/2024 11:38:14 AM TECHNIQUE: CTA of the chest was performed after the administration of intravenous contrast. Multiplanar reformatted images are provided for review. MIP images are provided for review. CT of the abdomen and pelvis was performed with and without the administration of intravenous contrast. Automated exposure control, iterative reconstruction, and/or weight based adjustment of the mA/kV was utilized to reduce the radiation dose to as low as reasonably achievable. COMPARISON: None available. CLINICAL HISTORY: Pulmonary embolism (PE) suspected, low to intermediate prob, positive D-dimer. Epigastric pain. FINDINGS: CHEST: PULMONARY ARTERIES: Pulmonary arteries are adequately opacified for evaluation. No intraluminal filling defect  to suggest pulmonary embolism. Main pulmonary artery is normal in caliber. MEDIASTINUM: Aneurysmal dilatation of the ascending thoracic aorta to 4.5 cm on coronal image 87 x 5. Aortic arch. No mediastinal lymphadenopathy. The heart and pericardium demonstrate no acute abnormality. LUNGS AND PLEURA: The lungs are without acute process. No pulmonary infarction. No pneumonia. No focal consolidation or pulmonary edema. No pleural fluid. No pneumothorax. SOFT TISSUES AND BONES: No acute bone or soft tissue abnormality. ABDOMEN AND PELVIS: LIVER: Multiple simple fluid attenuation cysts within the left and right hepatic lobes. All about normal. GALLBLADDER AND BILE DUCTS: Gallbladder is normal. No  biliary ductal dilatation. SPLEEN: Spleen demonstrates no acute abnormality. PANCREAS: No evidence of pancreatitis. ADRENAL GLANDS: Adrenal glands demonstrate no acute abnormality. KIDNEYS, URETERS AND BLADDER: No stones in the kidneys or ureters. No hydronephrosis. No perinephric or periureteral stranding. Urinary bladder is unremarkable. GI AND BOWEL: The stomach is normal. There is submucosal edema involving the pylorus and 1st and 2nd portion of the duodenum (imaging 24 of series 4). Trace inflammatory stranding within the retroperitoneal fat along the second portion of the duodenum. Normal appendix. There is no bowel obstruction. No abnormal bowel wall thickening or distension. 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 PERITONEUM AND RETROPERITONEUM: No ascites or free air. LYMPH NODES: No lymphadenopathy. BONES AND SOFT TISSUES: No acute abnormality of the visualized bones. No focal soft tissue abnormality. IMPRESSION: 1. No evidence of acute pulmonary disease. No acute pulmonary parenchymal findings. 2. Aneurysmal dilatation of the ascending thoracic aorta to 4.5 cm. Recommend 25-month interval surveillance imaging. 3. Duodenitis involving the pylorus and first and second portions of the duodenum, with trace inflammatory stranding in the adjacent retroperitoneal fat. No evidence of pancreatitis. Electronically signed by: Norleen Boxer MD 10/04/2024 12:32 PM EST RP Workstation: [id number]   CT ABDOMEN PELVIS W CONTRAST Result Date: 10/04/2024 EXAM: CTA CHEST PE WITH AND WITHOUT AND WITH CONTRAST CT ABDOMEN AND PELVIS WITH AND WITHOUT AND WITH CONTRAST 10/04/2024 11:38:14 AM TECHNIQUE: CTA of the chest was performed after the administration of intravenous contrast. Multiplanar reformatted images are provided for review. MIP images are provided for review. CT of the abdomen and pelvis was performed with and without the administration of intravenous contrast. Automated  exposure control, iterative reconstruction, and/or weight based adjustment of the mA/kV was utilized to reduce the radiation dose to as low as reasonably achievable. COMPARISON: None available. CLINICAL HISTORY: Pulmonary embolism (PE) suspected, low to intermediate prob, positive D-dimer. Epigastric pain. FINDINGS: CHEST: PULMONARY ARTERIES: Pulmonary arteries are adequately opacified for evaluation. No intraluminal filling defect to suggest pulmonary embolism. Main pulmonary artery is normal in caliber. MEDIASTINUM: Aneurysmal dilatation of the ascending thoracic aorta to 4.5 cm on coronal image 87 x 5. Aortic arch. No mediastinal lymphadenopathy. The heart and pericardium demonstrate no acute abnormality. LUNGS AND PLEURA: The lungs are without acute process. No pulmonary infarction. No pneumonia. No focal consolidation or pulmonary edema. No pleural fluid. No pneumothorax. SOFT TISSUES AND BONES: No acute bone or soft tissue abnormality. ABDOMEN AND PELVIS: LIVER: Multiple simple fluid attenuation cysts within the left and right hepatic lobes. All about normal. GALLBLADDER AND BILE DUCTS: Gallbladder is normal. No biliary ductal dilatation. SPLEEN: Spleen demonstrates no acute abnormality. PANCREAS: No evidence of pancreatitis. ADRENAL GLANDS: Adrenal glands demonstrate no acute abnormality. KIDNEYS, URETERS AND BLADDER: No stones in the kidneys or ureters. No hydronephrosis. No perinephric or periureteral stranding. Urinary bladder is unremarkable. GI AND BOWEL: The stomach is normal. There is  submucosal edema involving the pylorus and 1st and 2nd portion of the duodenum (imaging 24 of series 4). Trace inflammatory stranding within the retroperitoneal fat along the second portion of the duodenum. Normal appendix. There is no bowel obstruction. No abnormal bowel wall thickening or distension. 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 PERITONEUM AND RETROPERITONEUM: No ascites  or free air. LYMPH NODES: No lymphadenopathy. BONES AND SOFT TISSUES: No acute abnormality of the visualized bones. No focal soft tissue abnormality. IMPRESSION: 1. No evidence of acute pulmonary disease. No acute pulmonary parenchymal findings. 2. Aneurysmal dilatation of the ascending thoracic aorta to 4.5 cm. Recommend 14-month interval surveillance imaging. 3. Duodenitis involving the pylorus and first and second portions of the duodenum, with trace inflammatory stranding in the adjacent retroperitoneal fat. No evidence of pancreatitis. Electronically signed by: Norleen Boxer MD 10/04/2024 12:32 PM EST RP Workstation: [id number]    Cardiac Studies   TTE 10/05/24:  IMPRESSIONS     1. Left ventricular ejection fraction, by estimation, is 45 to 50%. The  left ventricle has mildly decreased function. The left ventricle  demonstrates regional wall motion abnormalities (see scoring  diagram/findings for description). The left ventricular   internal cavity size was mildly dilated. There is mild concentric left  ventricular hypertrophy. Left ventricular diastolic parameters were  normal.   2. Right ventricular systolic function is normal. The right ventricular  size is normal. There is normal pulmonary artery systolic pressure.   3. A small pericardial effusion is present. The pericardial effusion is  anterior to the right ventricle. There is no evidence of cardiac  tamponade.   4. The mitral valve is degenerative. Trivial mitral valve regurgitation.  No evidence of mitral stenosis.   5. The tricuspid valve is degenerative.   6. The aortic valve is tricuspid. Aortic valve regurgitation is moderate.  Aortic valve sclerosis is present, with no evidence of aortic valve  stenosis.   7. Aortic dilatation noted. There is moderate dilatation of the ascending  aorta, measuring 44 mm.   8. The inferior vena cava is normal in size with greater than 50%  respiratory variability, suggesting right  atrial pressure of 3 mmHg.   Patient Profile   Lindsey Cooper is a 68 y.o. female with a hx of hyperlipidemia and thoracic aortic aneurysm who is being seen 10/04/2024 for the evaluation of epigastric pain at the request of Dr. Tobie.   Assessment & Plan    #Possible NSTEMI #Abdominal Pain, resolved #Nausea/Vomiting, Resolved - Patient presented with ongoing nausea, vomiting, and abdominal pain. - CT imaging was notable for duodenitis as a possible etiology. -On admission however, troponins were elevated so the picture of whether or not this was ACS versus a primary GI issue was unclear. - A TTE was performed yesterday which demonstrates focal wall motion abnormalities involving the anteroseptum and apex concerning for a possible LAD territory lesion. - Even though the patient is asymptomatic, given that she has focal wall motion abnormalities, mildly reduced LVEF, and symptoms that could be anginal equivalent particularly in a female, I am inclined to treat this patient as if she is having ACS. - I explained the results with the patient and her husband and they are agreeable to stay and undergo LHC on Monday. - I will restart the patient on heparin and aspirin until her cath results. Plan for [facility] Monday Restart heparin per pharmacy Restart aspirin 81 mg daily I checked troponin and BNP and both are elevated for the supporting  the diagnosis of NSTEMI Will check lipids, TSH, A1c  #Mildly Reduced LV Systolic Function - TTE read demonstrates LVEF ranging between 45 to 50%. - I independently reviewed the images myself and I think her EF is closer to 50%. - I we will start her on a low-dose beta-blocker for now, but may ultimately be added discontinue pending catheterization results. - She should probably on a beta-blocker anyway given her TAA. Start metoprolol to tartrate 12.5 mg twice daily  #HLD Continue atorvastatin 40 mg daily Lipid panel as above        For questions or  updates, please contact [facility] Please consult [email or web address] for contact info under       Signed, Georganna Archer, MD  10/06/2024, 10:05 AM

## 2024-10-06 NOTE — Progress Notes (Signed)
 ANTICOAGULATION CONSULT NOTE  Pharmacy Consult for heparin Indication: chest pain/ACS  Allergies  Allergen Reactions   Penicillins Other (See Comments)    Other reaction(s): YEAST INFECTION    Patient Measurements: Height: 5' 4.5 (163.8 cm) Weight: 62.1 kg (137 lb) IBW/kg (Calculated) : 55.85 Heparin Dosing Weight: 62 kg   Vital Signs: Temp: 98.2 F (36.8 C) (11/15 2028) Temp Source: Oral (11/15 2028) BP: 122/66 (11/15 2028) Pulse Rate: 63 (11/15 2028)  Labs: Recent Labs    10/04/24 0952 10/05/24 0237 10/06/24 1045 10/06/24 2059  HGB 14.1 12.7  --  14.2  HCT 41.6 38.4  --  41.8  PLT 234 204  --  240  HEPARINUNFRC  --   --   --  0.98*  CREATININE 0.68 0.74  --   --   TROPONINIHS  --   --  33*  --     Estimated Creatinine Clearance: 58.6 mL/min (by C-G formula based on SCr of 0.74 mg/dL).  Medical History: Past Medical History:  Diagnosis Date   Hyperlipidemia    Thoracic aortic aneurysm     Medications:  See MAR  PTA Anticoagulation: none  Assessment: 17 yoF presented to ED 11/13 with abdominal pain found to have elevated troponins/d-dimer and started on heparin IV in the ED which was stopped by cardiology with low concern for ACS.  Pt remains asymptomatic but with new echo findings (LVEF 45%, focal wall motion abnormalities), restarting heparin for empiric ACS and plan for cath on Monday 11/17.   Initial heparin level 0.98 is supratherapeutic after bolus and infusion of 750 units/hr - drawn from opposite arm per RN.  No s/sx bleeding or infusion issues noted. Hgb 14.2, pltc 240.    Goal of Therapy:  Heparin level 0.3-0.7 units/ml Monitor platelets by anticoagulation protocol: Yes   Plan:  Decrease heparin IV to 600 units/hr.   6 hour anti-Xa level Daily CBC, anti-Xa level Monitor for s/sx of bleeding F/u after LHC 11/17  Maurilio Fila, PharmD Clinical Pharmacist 10/06/2024  10:04 PM

## 2024-10-06 NOTE — Plan of Care (Signed)

## 2024-10-06 NOTE — Progress Notes (Addendum)
 PROGRESS NOTE   Lindsey Cooper  FMW:[id number]    DOB: 05/23/1956    DOA: 10/04/2024  PCP: Lindsey Planas, MD   I have briefly reviewed patients previous medical records in [facility].   Brief Hospital Course:  68 year old female with medical history significant for HLD, ascending thoracic aortic aneurysm, presented to the ED with complaints of new onset epigastric abdominal pain with nausea and vomiting that started on 09/29/2024.  She thought that she might have caught a stomach bug.  She denied eating out or eating anything unusual.  She took some Pepto-Bismol and antacid with some mild relief.  She felt better the next day but by the day after, she had recurrent abdominal pain but no further nausea or vomiting.  She did have some dry heaves.  She reported normal BMs without diarrhea.  No fever, chills or diaphoresis.  Through the entire course, no chest pain, dyspnea or dizziness.   She was reportedly seen in the [place] [facility] on 11/13 and she was sent to ED for further evaluation.  She denies any new medications or NSAID use.   Since hospital admission, her vital signs have been stable overall.  CMP and CBC were unremarkable.  Serial high-sensitivity troponin T mildly elevated with downward trend: 95 > 81.  D-dimer elevated at 1.31.  CTA chest PE protocol and CT A/P, showed no pulmonary embolism or acute disease.  Which showed aneurysmal dilatation of ascending thoracic aorta to 4.5 cm.  It also showed duodenitis involving the pylorus and 1st and 2nd portions of the duodenum, with trace inflammatory stranding in the adjacent retroperitoneal fat.  No evidence of pancreatitis.   In the ED she was treated with a liter of IV fluid bolus, maintenance IV fluids, briefly started on IV heparin drip, IV Protonix, IV Zofran, IV morphine.  Cardiology was consulted.  Echo showed LVEF 45-50% with LV regional wall motion abnormalities.  Awaiting cardiology follow-up and recommendations.   Assessment &  Plan:   Epigastric pain/duodenitis: Etiology unclear.  No NSAID use, alcohol or tobacco use, eating anything unusual or eating out recently.  Of note, she is on Fosamax-follow-up with PCP regarding continuation or holding temporarily.  She is asymptomatic of abdominal pain, nausea or vomiting today.  Continue Protonix 40 mg daily x 1 month, close outpatient follow-up with with PCP and could consider outpatient GI consultation if patient has recurrence of symptoms or do not resolve.  Stable without recurrence in the hospital.   Elevated troponins/possible NSTEMI: EKG showed nonspecific findings.  CTA negative for PE.  Cardiology was not concerned for ACS and stopped heparin drip in ED. Cardiology consulted. 2D echo results reviewed, LVEF 45-50% with LV regional wall motion abnormalities.  Communicated with Dr. Michele, Cardiology who recommended that we keep the patient in-house until they see her again with recommendations today.  Awaiting cardiology follow-up.    Addendum: Since review earlier this morning, cardiology has seen.  Suspect will NSTEMI, have restarted aspirin, heparin, on low-dose beta-blockers, plan for LHC on 11/17.  I communicated with Dr. Floretta, Cardiology.   Thoracic aortic aneurysm: Ascending aorta 4.5 cm.  As per recommendations made below, needs 42-month interval surveillance imaging.  Patient is aware of the diagnosis and indicates that her family physician follows this.  Continue statins.   Hyperlipidemia: Continue home dose of statins.  Body mass index is 23.15 kg/m.   DVT prophylaxis: enoxaparin (LOVENOX) injection 40 mg Start: 10/04/24 2000     Code Status: Full Code:  Family Communication: Spouse at bedside. Disposition:  Meets for inpatient intensity based on the NSTEMI, IV heparin, plan for LHC and care will cross 2 midnights.     Consultants:   Cardiology  Procedures:     Subjective:  No recurrence of abdominal pain.  Never had chest pain or  dyspnea.  Objective:   Vitals:   10/05/24 1602 10/05/24 1945 10/06/24 0421 10/06/24 0826  BP: 107/66 117/65 128/65 133/78  Pulse: 65 66 66 73  Resp: 16 15 17 16   Temp: 97.9 F (36.6 C) 98.8 F (37.1 C) 98.1 F (36.7 C) 97.7 F (36.5 C)  TempSrc: Oral Oral  Oral  SpO2: 98% 98% 90% 97%  Weight:      Height:        General: Middle-age female, moderately built and nourished lying comfortably propped up in bed without distress. Cardiovascular: S1 & S2 heard, RRR, S1/S2 +. No murmurs, rubs, gallops or clicks. No JVD or pedal edema.  Telemetry personally reviewed: Sinus rhythm.  Stable. Respiratory: Clear to auscultation without wheezing, rhonchi or crackles. No increased work of breathing.  Stable. Abdominal:  Non distended, non tender & soft. No organomegaly or masses appreciated. Normal bowel sounds heard. CNS: Alert and oriented. No focal deficits. Extremities: no edema, no cyanosis    Data Reviewed:   I have personally reviewed following labs and imaging studies   CBC: Recent Labs  Lab 10/04/24 0952 10/05/24 0237  WBC 7.0 6.2  NEUTROABS 4.7  --   HGB 14.1 12.7  HCT 41.6 38.4  MCV 94.8 97.2  PLT 234 204    Basic Metabolic Panel: Recent Labs  Lab 10/04/24 0952 10/05/24 0237  NA 140 141  K 4.4 4.1  CL 105 108  CO2 24 23  GLUCOSE 104* 93  BUN 10 7*  CREATININE 0.68 0.74  CALCIUM 9.9 8.4*    Liver Function Tests: Recent Labs  Lab 10/04/24 0952  AST 19  ALT 16  ALKPHOS 78  BILITOT 0.5  PROT 7.2  ALBUMIN 4.1    CBG: No results for input(s): GLUCAP in the last 168 hours.  Microbiology Studies:  No results found for this or any previous visit (from the past 240 hours).  Radiology Studies:  ECHOCARDIOGRAM COMPLETE Result Date: 10/05/2024    ECHOCARDIOGRAM REPORT   Patient Name:   Lindsey Cooper Date of Exam: 10/05/2024 Medical Rec #:  [id number]     Height:       64.5 in Accession #:    [id number]    Weight:       137.0 lb Date of Birth:   [birth year]    BSA:          1.675 m Patient Age:    67 years      BP:           119/71 mmHg Patient Gender: F             HR:           63 bpm. Exam Location:  Inpatient Procedure: 2D Echo, 3D Echo, Cardiac Doppler and Color Doppler (Both Spectral            and Color Flow Doppler were utilized during procedure). Indications:    Chest Pain R07.9  History:        Patient has no prior history of Echocardiogram examinations.                 Elevated troponin, hx of thoracic AA, Signs/Symptoms:Chest Pain;  Risk Factors:Dyslipidemia.  Sonographer:    Koleen Popper RDCS Referring Phys: [id number] VISHAL R PATEL IMPRESSIONS  1. Left ventricular ejection fraction, by estimation, is 45 to 50%. The left ventricle has mildly decreased function. The left ventricle demonstrates regional wall motion abnormalities (see scoring diagram/findings for description). The left ventricular  internal cavity size was mildly dilated. There is mild concentric left ventricular hypertrophy. Left ventricular diastolic parameters were normal.  2. Right ventricular systolic function is normal. The right ventricular size is normal. There is normal pulmonary artery systolic pressure.  3. A small pericardial effusion is present. The pericardial effusion is anterior to the right ventricle. There is no evidence of cardiac tamponade.  4. The mitral valve is degenerative. Trivial mitral valve regurgitation. No evidence of mitral stenosis.  5. The tricuspid valve is degenerative.  6. The aortic valve is tricuspid. Aortic valve regurgitation is moderate. Aortic valve sclerosis is present, with no evidence of aortic valve stenosis.  7. Aortic dilatation noted. There is moderate dilatation of the ascending aorta, measuring 44 mm.  8. The inferior vena cava is normal in size with greater than 50% respiratory variability, suggesting right atrial pressure of 3 mmHg. Comparison(s): No prior Echocardiogram. FINDINGS  Left Ventricle: Left ventricular  ejection fraction, by estimation, is 45 to 50%. The left ventricle has mildly decreased function. The left ventricle demonstrates regional wall motion abnormalities. 3D ejection fraction reviewed and evaluated as part of  the interpretation. Alternate measurement of EF is felt to be most reflective of LV function. The left ventricular internal cavity size was mildly dilated. There is mild concentric left ventricular hypertrophy. Left ventricular diastolic parameters were  normal.  LV Wall Scoring: The anterior septum is akinetic. The inferior septum and entire inferior wall are hypokinetic. Right Ventricle: The right ventricular size is normal. No increase in right ventricular wall thickness. Right ventricular systolic function is normal. There is normal pulmonary artery systolic pressure. The tricuspid regurgitant velocity is 2.28 m/s, and  with an assumed right atrial pressure of 3 mmHg, the estimated right ventricular systolic pressure is 23.8 mmHg. Left Atrium: Left atrial size was normal in size. Right Atrium: Right atrial size was not assessed. Pericardium: A small pericardial effusion is present. The pericardial effusion is anterior to the right ventricle. There is no evidence of cardiac tamponade. Mitral Valve: The mitral valve is degenerative in appearance. There is moderate thickening of the mitral valve leaflet(s). Trivial mitral valve regurgitation. No evidence of mitral valve stenosis. Tricuspid Valve: The tricuspid valve is degenerative in appearance. Tricuspid valve regurgitation is mild . No evidence of tricuspid stenosis. Aortic Valve: The aortic valve is tricuspid. Aortic valve regurgitation is moderate. PHT 430 ms. Aortic valve sclerosis is present, with no evidence of aortic valve stenosis. Pulmonic Valve: The pulmonic valve was not well visualized. Pulmonic valve regurgitation is not visualized. No evidence of pulmonic stenosis. Aorta: The aortic root is normal in size and structure and aortic  dilatation noted. There is moderate dilatation of the ascending aorta, measuring 44 mm. Venous: The inferior vena cava is normal in size with greater than 50% respiratory variability, suggesting right atrial pressure of 3 mmHg. IAS/Shunts: No atrial level shunt detected by color flow Doppler. Additional Comments: 3D was performed not requiring image post processing on an independent workstation and was normal.  LEFT VENTRICLE PLAX 2D LVIDd:         4.90 cm LVIDs:         2.70 cm LV PW:  1.10 cm LV IVS:        1.10 cm                             3D Volume EF:                             3D EF:        64 % LV Volumes (MOD) LV vol d, MOD A4C: 153.0 ml LV vol s, MOD A4C: 58.9 ml LV SV MOD A4C:     153.0 ml RIGHT VENTRICLE TAPSE (M-mode): 1.5 cm LEFT ATRIUM           Index LA diam:      3.83 cm 2.29 cm/m LA Vol (A2C): 39.7 ml 23.71 ml/m LA Vol (A4C): 32.2 ml 19.23 ml/m  AORTIC VALVE LVOT Vmax:         104.00 cm/s LVOT Vmean:        66.800 cm/s LVOT VTI:          0.210 m AR Vena Contracta: 0.40 cm MITRAL VALVE              TRICUSPID VALVE MV Area (PHT): 3.99 cm   TR Peak grad:   20.8 mmHg MV Decel Time: 190 msec   TR Vmax:        228.00 cm/s MR Peak grad: 69.9 mmHg MR Vmax:      418.00 cm/s SHUNTS                           Systemic VTI: 0.21 m Emeline Calender Electronically signed by Emeline Calender Signature Date/Time: 10/05/2024/3:56:13 PM    Final    CT Angio Chest PE W and/or Wo Contrast Result Date: 10/04/2024 EXAM: CTA CHEST PE WITH AND WITHOUT AND WITH CONTRAST CT ABDOMEN AND PELVIS WITH AND WITHOUT AND WITH CONTRAST 10/04/2024 11:38:14 AM TECHNIQUE: CTA of the chest was performed after the administration of intravenous contrast. Multiplanar reformatted images are provided for review. MIP images are provided for review. CT of the abdomen and pelvis was performed with and without the administration of intravenous contrast. Automated exposure control, iterative reconstruction, and/or weight based adjustment of  the mA/kV was utilized to reduce the radiation dose to as low as reasonably achievable. COMPARISON: None available. CLINICAL HISTORY: Pulmonary embolism (PE) suspected, low to intermediate prob, positive D-dimer. Epigastric pain. FINDINGS: CHEST: PULMONARY ARTERIES: Pulmonary arteries are adequately opacified for evaluation. No intraluminal filling defect to suggest pulmonary embolism. Main pulmonary artery is normal in caliber. MEDIASTINUM: Aneurysmal dilatation of the ascending thoracic aorta to 4.5 cm on coronal image 87 x 5. Aortic arch. No mediastinal lymphadenopathy. The heart and pericardium demonstrate no acute abnormality. LUNGS AND PLEURA: The lungs are without acute process. No pulmonary infarction. No pneumonia. No focal consolidation or pulmonary edema. No pleural fluid. No pneumothorax. SOFT TISSUES AND BONES: No acute bone or soft tissue abnormality. ABDOMEN AND PELVIS: LIVER: Multiple simple fluid attenuation cysts within the left and right hepatic lobes. All about normal. GALLBLADDER AND BILE DUCTS: Gallbladder is normal. No biliary ductal dilatation. SPLEEN: Spleen demonstrates no acute abnormality. PANCREAS: No evidence of pancreatitis. ADRENAL GLANDS: Adrenal glands demonstrate no acute abnormality. KIDNEYS, URETERS AND BLADDER: No stones in the kidneys or ureters. No hydronephrosis. No perinephric or periureteral stranding. Urinary bladder is unremarkable. GI AND BOWEL: The stomach is normal. There  is submucosal edema involving the pylorus and 1st and 2nd portion of the duodenum (imaging 24 of series 4). Trace inflammatory stranding within the retroperitoneal fat along the second portion of the duodenum. Normal appendix. There is no bowel obstruction. No abnormal bowel wall thickening or distension. 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 PERITONEUM AND RETROPERITONEUM: No ascites or free air. LYMPH NODES: No lymphadenopathy. BONES AND SOFT TISSUES: No acute  abnormality of the visualized bones. No focal soft tissue abnormality. IMPRESSION: 1. No evidence of acute pulmonary disease. No acute pulmonary parenchymal findings. 2. Aneurysmal dilatation of the ascending thoracic aorta to 4.5 cm. Recommend 38-month interval surveillance imaging. 3. Duodenitis involving the pylorus and first and second portions of the duodenum, with trace inflammatory stranding in the adjacent retroperitoneal fat. No evidence of pancreatitis. Electronically signed by: Norleen Boxer MD 10/04/2024 12:32 PM EST RP Workstation: [id number]   CT ABDOMEN PELVIS W CONTRAST Result Date: 10/04/2024 EXAM: CTA CHEST PE WITH AND WITHOUT AND WITH CONTRAST CT ABDOMEN AND PELVIS WITH AND WITHOUT AND WITH CONTRAST 10/04/2024 11:38:14 AM TECHNIQUE: CTA of the chest was performed after the administration of intravenous contrast. Multiplanar reformatted images are provided for review. MIP images are provided for review. CT of the abdomen and pelvis was performed with and without the administration of intravenous contrast. Automated exposure control, iterative reconstruction, and/or weight based adjustment of the mA/kV was utilized to reduce the radiation dose to as low as reasonably achievable. COMPARISON: None available. CLINICAL HISTORY: Pulmonary embolism (PE) suspected, low to intermediate prob, positive D-dimer. Epigastric pain. FINDINGS: CHEST: PULMONARY ARTERIES: Pulmonary arteries are adequately opacified for evaluation. No intraluminal filling defect to suggest pulmonary embolism. Main pulmonary artery is normal in caliber. MEDIASTINUM: Aneurysmal dilatation of the ascending thoracic aorta to 4.5 cm on coronal image 87 x 5. Aortic arch. No mediastinal lymphadenopathy. The heart and pericardium demonstrate no acute abnormality. LUNGS AND PLEURA: The lungs are without acute process. No pulmonary infarction. No pneumonia. No focal consolidation or pulmonary edema. No pleural fluid. No pneumothorax. SOFT  TISSUES AND BONES: No acute bone or soft tissue abnormality. ABDOMEN AND PELVIS: LIVER: Multiple simple fluid attenuation cysts within the left and right hepatic lobes. All about normal. GALLBLADDER AND BILE DUCTS: Gallbladder is normal. No biliary ductal dilatation. SPLEEN: Spleen demonstrates no acute abnormality. PANCREAS: No evidence of pancreatitis. ADRENAL GLANDS: Adrenal glands demonstrate no acute abnormality. KIDNEYS, URETERS AND BLADDER: No stones in the kidneys or ureters. No hydronephrosis. No perinephric or periureteral stranding. Urinary bladder is unremarkable. GI AND BOWEL: The stomach is normal. There is submucosal edema involving the pylorus and 1st and 2nd portion of the duodenum (imaging 24 of series 4). Trace inflammatory stranding within the retroperitoneal fat along the second portion of the duodenum. Normal appendix. There is no bowel obstruction. No abnormal bowel wall thickening or distension. 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 PERITONEUM AND RETROPERITONEUM: No ascites or free air. LYMPH NODES: No lymphadenopathy. BONES AND SOFT TISSUES: No acute abnormality of the visualized bones. No focal soft tissue abnormality. IMPRESSION: 1. No evidence of acute pulmonary disease. No acute pulmonary parenchymal findings. 2. Aneurysmal dilatation of the ascending thoracic aorta to 4.5 cm. Recommend 72-month interval surveillance imaging. 3. Duodenitis involving the pylorus and first and second portions of the duodenum, with trace inflammatory stranding in the adjacent retroperitoneal fat. No evidence of pancreatitis. Electronically signed by: Norleen Boxer MD 10/04/2024 12:32 PM EST RP Workstation: [id number]    Scheduled  Meds:    amitriptyline  10 mg Oral QHS   atorvastatin  40 mg Oral Daily   enoxaparin (LOVENOX) injection  40 mg Subcutaneous Q24H   pantoprazole (PROTONIX) IV  40 mg Intravenous Q12H   sodium chloride flush  3 mL Intravenous Q12H     Continuous Infusions:     LOS: 0 days     Trenda Mar, MD,  FACP, [facility], [facility], [facility]   [facility] [place]      To contact the attending provider between 7A-7P or the covering provider during after hours 7P-7A, please log into the web site [email or web address] and access using universal [place] password for that web site. If you do not have the password, please call the hospital operator.  10/06/2024, 10:18 AM

## 2024-10-07 DIAGNOSIS — I214 Non-ST elevation (NSTEMI) myocardial infarction: Secondary | ICD-10-CM

## 2024-10-07 DIAGNOSIS — K2980 Duodenitis without bleeding: Secondary | ICD-10-CM | POA: Diagnosis not present

## 2024-10-07 LAB — CBC
HCT: 39.4 % (ref 36.0–46.0)
Hemoglobin: 13.5 g/dL (ref 12.0–15.0)
MCH: 32.2 pg (ref 26.0–34.0)
MCHC: 34.3 g/dL (ref 30.0–36.0)
MCV: 94 fL (ref 80.0–100.0)
Platelets: 220 K/uL (ref 150–400)
RBC: 4.19 MIL/uL (ref 3.87–5.11)
RDW: 11.9 % (ref 11.5–15.5)
WBC: 3.9 K/uL — ABNORMAL LOW (ref 4.0–10.5)
nRBC: 0 % (ref 0.0–0.2)

## 2024-10-07 LAB — LIPID PANEL
Cholesterol: 164 mg/dL (ref 0–200)
HDL: 48 mg/dL (ref >40)
LDL Cholesterol: 102 mg/dL — ABNORMAL HIGH (ref 0–99)
Total CHOL/HDL Ratio: 3.4 ratio
Triglycerides: 71 mg/dL (ref <150)
VLDL: 14 mg/dL (ref 0–40)

## 2024-10-07 LAB — HEMOGLOBIN A1C
Hgb A1c MFr Bld: 5.5 % (ref 4.8–5.6)
Mean Plasma Glucose: 111.15 mg/dL

## 2024-10-07 LAB — TSH: TSH: 0.193 u[IU]/mL — ABNORMAL LOW (ref 0.350–4.500)

## 2024-10-07 LAB — HEPARIN LEVEL (UNFRACTIONATED)
Heparin Unfractionated: 0.52 [IU]/mL (ref 0.30–0.70)
Heparin Unfractionated: 0.34 [IU]/mL (ref 0.30–0.70)

## 2024-10-07 LAB — MAGNESIUM: Magnesium: 2.3 mg/dL (ref 1.7–2.4)

## 2024-10-07 MED ORDER — FREE WATER
500.0000 mL | Freq: Once | Status: AC
  Administered 2024-10-08: 500 mL via ORAL

## 2024-10-07 MED ORDER — PANTOPRAZOLE SODIUM 40 MG PO TBEC
40.0000 mg | DELAYED_RELEASE_TABLET | Freq: Two times a day (BID) | ORAL | Status: DC
  Administered 2024-10-07 – 2024-10-08 (×3): 40 mg via ORAL
  Filled 2024-10-07 (×3): qty 1

## 2024-10-07 NOTE — Progress Notes (Signed)
 PROGRESS NOTE   Lindsey Cooper  FMW:[id number]    DOB: 07-12-1956    DOA: 10/04/2024  PCP: Cleotilde Planas, MD   I have briefly reviewed patients previous medical records in [facility].   Brief Hospital Course:  68 year old female with medical history significant for HLD, ascending thoracic aortic aneurysm, presented to the ED with complaints of new onset epigastric abdominal pain with nausea and vomiting that started on 09/29/2024.  She thought that she might have caught a stomach bug.  She denied eating out or eating anything unusual.  She took some Pepto-Bismol and antacid with some mild relief.  She felt better the next day but by the day after, she had recurrent abdominal pain but no further nausea or vomiting.  She did have some dry heaves.  She reported normal BMs without diarrhea.  No fever, chills or diaphoresis.  Through the entire course, no chest pain, dyspnea or dizziness.   She was reportedly seen in the [place] clinic on 11/13 and she was sent to ED for further evaluation.  She denies any new medications or NSAID use.   Since hospital admission, her vital signs have been stable overall.  CMP and CBC were unremarkable.  Serial high-sensitivity troponin T mildly elevated with downward trend: 95 > 81.  D-dimer elevated at 1.31.  CTA chest PE protocol and CT A/P, showed no pulmonary embolism or acute disease.  Which showed aneurysmal dilatation of ascending thoracic aorta to 4.5 cm.  It also showed duodenitis involving the pylorus and 1st and 2nd portions of the duodenum, with trace inflammatory stranding in the adjacent retroperitoneal fat.  No evidence of pancreatitis.   In the ED she was treated with a liter of IV fluid bolus, maintenance IV fluids, briefly started on IV heparin drip, IV Protonix, IV Zofran, IV morphine.  Cardiology was consulted.  Echo showed LVEF 45-50% with LV regional wall motion abnormalities.  Awaiting cardiology follow-up and recommendations.   Assessment &  Plan:   Epigastric pain/duodenitis: Etiology unclear.  No NSAID use, alcohol or tobacco use, eating anything unusual or eating out recently.  Of note, she is on Fosamax-follow-up with PCP regarding continuation or holding temporarily.  She is asymptomatic of abdominal pain, nausea or vomiting today.  Continue Protonix 40 mg daily x 1 month, close outpatient follow-up with with PCP and could consider outpatient GI consultation if patient has recurrence of symptoms or do not resolve.  Stable without recurrence in the hospital.   Elevated troponins/possible NSTEMI: EKG showed nonspecific findings.  CTA negative for PE.  Cardiology was not concerned for ACS and stopped heparin drip in ED. Cardiology consulted. 2D echo results reviewed, LVEF 45-50% with LV regional wall motion abnormalities.  Cardiology following.  Suspect NSTEMI, have restarted aspirin, heparin, on low-dose beta-blockers, plan for LHC on 11/17.    Thoracic aortic aneurysm: Ascending aorta 4.5 cm.  As per recommendations made below, needs 2-month interval surveillance imaging.  Patient is aware of the diagnosis and indicates that her family physician follows this.  Continue statins.   Hyperlipidemia: Continue home dose of statins.  Body mass index is 23.15 kg/m.   DVT prophylaxis:      Code Status: Full Code:  Family Communication: Spouse at bedside. Disposition:  Meets for inpatient intensity based on the NSTEMI, IV heparin, plan for LHC and care will cross 2 midnights.     Consultants:   Cardiology  Procedures:     Subjective:  No complaints.  Wondering when she will  discharge after [facility] tomorrow.  Advised her that it all depends on what they find on the [facility].  If no interventions i.e. stent etc., is possible that she could discharge tomorrow but if she does get PCI, she may have to stay an additional night.  She verbalized understanding.  Objective:   Vitals:   10/06/24 1646 10/06/24 2028 10/07/24 0330 10/07/24 0803   BP: 107/70 122/66 111/62 112/60  Pulse: 62 63 (!) 56 (!) 57  Resp: 18 18 18 18   Temp: 97.8 F (36.6 C) 98.2 F (36.8 C) 98.1 F (36.7 C) 97.9 F (36.6 C)  TempSrc: Oral Oral Oral   SpO2: 96% 95% 95% 98%  Weight:      Height:        General: Middle-age female, moderately built and nourished lying comfortably propped up in bed without distress.  Appeared to be in pleasant spirits. Cardiovascular: S1 & S2 heard, RRR, S1/S2 +. No murmurs, rubs, gallops or clicks. No JVD or pedal edema.  Stable. Respiratory: Clear to auscultation without wheezing, rhonchi or crackles. No increased work of breathing.  Stable. Abdominal:  Non distended, non tender & soft. No organomegaly or masses appreciated. Normal bowel sounds heard. CNS: Alert and oriented. No focal deficits. Extremities: no edema, no cyanosis    Data Reviewed:   I have personally reviewed following labs and imaging studies   CBC: Recent Labs  Lab 10/04/24 0952 10/05/24 0237 10/06/24 2059 10/07/24 0710  WBC 7.0 6.2 6.6 3.9*  NEUTROABS 4.7  --   --   --   HGB 14.1 12.7 14.2 13.5  HCT 41.6 38.4 41.8 39.4  MCV 94.8 97.2 93.9 94.0  PLT 234 204 240 220    Basic Metabolic Panel: Recent Labs  Lab 10/04/24 0952 10/05/24 0237 10/07/24 0710  NA 140 141  --   K 4.4 4.1  --   CL 105 108  --   CO2 24 23  --   GLUCOSE 104* 93  --   BUN 10 7*  --   CREATININE 0.68 0.74  --   CALCIUM 9.9 8.4*  --   MG  --   --  2.3    Liver Function Tests: Recent Labs  Lab 10/04/24 0952  AST 19  ALT 16  ALKPHOS 78  BILITOT 0.5  PROT 7.2  ALBUMIN 4.1    CBG: No results for input(s): GLUCAP in the last 168 hours.  Microbiology Studies:  No results found for this or any previous visit (from the past 240 hours).  Radiology Studies:  No results found.   Scheduled Meds:    amitriptyline  10 mg Oral Q0600   aspirin EC  81 mg Oral Daily   atorvastatin  40 mg Oral Daily   metoprolol tartrate  12.5 mg Oral BID    pantoprazole  40 mg Oral BID   sodium chloride flush  3 mL Intravenous Q12H    Continuous Infusions:    heparin 600 Units/hr (10/06/24 2304)     LOS: 1 day     Trenda Mar, MD,  FACP, [facility], [facility], [facility]   [facility]ician Advisor [place]      To contact the attending provider between 7A-7P or the covering provider during after hours 7P-7A, please log into the web site [email or web address] and access using universal [place] password for that web site. If you do not have the password, please call the hospital operator.  10/07/2024, 2:53 PM

## 2024-10-07 NOTE — Plan of Care (Signed)
  Problem: Clinical Measurements: Goal: Ability to maintain clinical measurements within normal limits will improve Outcome: Progressing Goal: Will remain free from infection Outcome: Progressing Goal: Diagnostic test results will improve Outcome: Progressing   Problem: Activity: Goal: Risk for activity intolerance will decrease Outcome: Progressing   Problem: Nutrition: Goal: Adequate nutrition will be maintained Outcome: Progressing   

## 2024-10-07 NOTE — Progress Notes (Signed)
 ANTICOAGULATION CONSULT NOTE  Pharmacy Consult for heparin Indication: chest pain/ACS  Allergies  Allergen Reactions   Penicillins Other (See Comments)    Other reaction(s): YEAST INFECTION    Patient Measurements: Height: 5' 4.5 (163.8 cm) Weight: 62.1 kg (137 lb) IBW/kg (Calculated) : 55.85 Heparin Dosing Weight: 62 kg   Vital Signs: Temp: 97.9 F (36.6 C) (11/16 0803) Temp Source: Oral (11/16 0330) BP: 112/60 (11/16 0803) Pulse Rate: 57 (11/16 0803)  Labs: Recent Labs    10/04/24 0952 10/05/24 0237 10/06/24 1045 10/06/24 2059 10/07/24 0710  HGB 14.1 12.7  --  14.2 13.5  HCT 41.6 38.4  --  41.8 39.4  PLT 234 204  --  240 220  HEPARINUNFRC  --   --   --  0.98* 0.52  CREATININE 0.68 0.74  --   --   --   TROPONINIHS  --   --  33*  --   --     Estimated Creatinine Clearance: 58.6 mL/min (by C-G formula based on SCr of 0.74 mg/dL).  Medical History: Past Medical History:  Diagnosis Date   Hyperlipidemia    Thoracic aortic aneurysm     Medications:  See MAR  PTA Anticoagulation: none  Assessment: 58 yoF presented to ED 11/13 with abdominal pain found to have elevated troponins/d-dimer and started on heparin IV in the ED which was stopped by cardiology with low concern for ACS.  Pt remains asymptomatic but with new echo findings (LVEF 45%, focal wall motion abnormalities), restarting heparin for empiric ACS and plan for cath on Monday 11/17.   Heparin level 0.52 is therapeutic after  heparin infusion rate decreased to 600 units/hr .  No s/sx bleeding or infusion issues noted. Hgb 13.5, pltc 220, stable.   Goal of Therapy:  Heparin level 0.3-0.7 units/ml Monitor platelets by anticoagulation protocol: Yes   Plan:  Continue Heparin IV  600 units/hr.   6 hour anti-Xa level to confirm Daily CBC, anti-Xa level Monitor for s/sx of bleeding F/u after LHC 11/17  Levorn Gaskins, RPh Clinical Pharmacist 10/07/2024  8:28 AM

## 2024-10-07 NOTE — Progress Notes (Signed)
 ANTICOAGULATION CONSULT NOTE  Pharmacy Consult for heparin Indication: chest pain/ACS  Allergies  Allergen Reactions   Penicillins Other (See Comments)    Other reaction(s): YEAST INFECTION    Patient Measurements: Height: 5' 4.5 (163.8 cm) Weight: 62.1 kg (137 lb) IBW/kg (Calculated) : 55.85 Heparin Dosing Weight: 62 kg   Vital Signs: Temp: 97.7 F (36.5 C) (11/16 1602) BP: 111/67 (11/16 1602) Pulse Rate: 62 (11/16 1602)  Labs: Recent Labs    10/05/24 0237 10/06/24 1045 10/06/24 2059 10/07/24 0710 10/07/24 1429  HGB 12.7  --  14.2 13.5  --   HCT 38.4  --  41.8 39.4  --   PLT 204  --  240 220  --   HEPARINUNFRC  --   --  0.98* 0.52 0.34  CREATININE 0.74  --   --   --   --   TROPONINIHS  --  33*  --   --   --     Estimated Creatinine Clearance: 58.6 mL/min (by C-G formula based on SCr of 0.74 mg/dL).  Medical History: Past Medical History:  Diagnosis Date   Hyperlipidemia    Thoracic aortic aneurysm     Medications:  See MAR  PTA Anticoagulation: none  Assessment: 53 yoF presented to ED 11/13 with abdominal pain found to have elevated troponins/d-dimer and started on heparin IV in the ED which was stopped by cardiology with low concern for ACS.  Pt remains asymptomatic but with new echo findings (LVEF 45%, focal wall motion abnormalities), restarting heparin for empiric ACS and plan for cath on Monday 11/17.   Heparin level 0.52 is therapeutic after  heparin infusion rate decreased to 600 units/hr .  No s/sx bleeding or infusion issues noted. Hgb 13.5, pltc 220, stable.   PM: heparin level 0.32 is at low end of of goal range and decreasing on 600 units/hr. No issues with the infusion or bleeding reported.  Goal of Therapy:  Heparin level 0.3-0.7 units/ml Monitor platelets by anticoagulation protocol: Yes   Plan:  Increase Heparin IV  650 units/hr to prevent further decrease Daily CBC, anti-Xa level Monitor for s/sx of bleeding F/u after LHC  11/17  Rocky Slade, PharmD, BCPS Clinical Pharmacist 10/07/2024  4:11 PM

## 2024-10-07 NOTE — Progress Notes (Signed)
 Rounding Note   Patient Name: Lindsey Cooper Date of Encounter: 10/07/2024  [facility] Cardiologist: None   Subjective - No acute events overnight - Patient has no complaints denying chest pain, SOB, nausea, vomiting  Scheduled Meds:  amitriptyline  10 mg Oral Q0600   aspirin EC  81 mg Oral Daily   atorvastatin  40 mg Oral Daily   metoprolol tartrate  12.5 mg Oral BID   pantoprazole  40 mg Oral BID   sodium chloride flush  3 mL Intravenous Q12H   Continuous Infusions:  heparin 600 Units/hr (10/06/24 2304)   PRN Meds: acetaminophen **OR** acetaminophen, HYDROcodone-acetaminophen, morphine injection, ondansetron **OR** ondansetron (ZOFRAN) IV, senna-docusate   Vital Signs  Vitals:   10/06/24 1646 10/06/24 2028 10/07/24 0330 10/07/24 0803  BP: 107/70 122/66 111/62 112/60  Pulse: 62 63 (!) 56 (!) 57  Resp: 18 18 18 18   Temp: 97.8 F (36.6 C) 98.2 F (36.8 C) 98.1 F (36.7 C) 97.9 F (36.6 C)  TempSrc: Oral Oral Oral   SpO2: 96% 95% 95% 98%  Weight:      Height:        Intake/Output Summary (Last 24 hours) at 10/07/2024 1230 Last data filed at 10/06/2024 1851 Gross per 24 hour  Intake 360 ml  Output --  Net 360 ml      10/04/2024    1:00 PM 09/20/2023   10:08 AM  Last 3 Weights  Weight (lbs) 137 lb 135 lb 9.6 oz  Weight (kg) 62.143 kg 61.508 kg      Telemetry No telemetry  ECG  No new ECG  Physical Exam  GEN: No acute distress.   Neck: No JVD Cardiac: RRR, no murmurs, rubs, or gallops.  Respiratory: Clear to auscultation bilaterally. GI: Soft, nontender, non-distended  MS: No edema; No deformity. Neuro:  Nonfocal  Psych: Normal affect   Labs High Sensitivity Troponin:   Recent Labs  Lab 10/06/24 1045  TROPONINIHS 33*     Chemistry Recent Labs  Lab 10/04/24 0952 10/05/24 0237 10/07/24 0710  NA 140 141  --   K 4.4 4.1  --   CL 105 108  --   CO2 24 23  --   GLUCOSE 104* 93  --   BUN 10 7*  --   CREATININE 0.68 0.74   --   CALCIUM 9.9 8.4*  --   MG  --   --  2.3  PROT 7.2  --   --   ALBUMIN 4.1  --   --   AST 19  --   --   ALT 16  --   --   ALKPHOS 78  --   --   BILITOT 0.5  --   --   GFRNONAA >60 >60  --   ANIONGAP 11 10  --     Lipids  Recent Labs  Lab 10/07/24 0710  CHOL 164  TRIG 71  HDL 48  LDLCALC 102*  CHOLHDL 3.4    Hematology Recent Labs  Lab 10/05/24 0237 10/06/24 2059 10/07/24 0710  WBC 6.2 6.6 3.9*  RBC 3.95 4.45 4.19  HGB 12.7 14.2 13.5  HCT 38.4 41.8 39.4  MCV 97.2 93.9 94.0  MCH 32.2 31.9 32.2  MCHC 33.1 34.0 34.3  RDW 11.9 11.8 11.9  PLT 204 240 220   Thyroid   Recent Labs  Lab 10/07/24 0710  TSH 0.193*    BNP Recent Labs  Lab 10/06/24 1045  BNP 141.9*  DDimer  Recent Labs  Lab 10/04/24 1009  DDIMER 1.31*     Radiology  ECHOCARDIOGRAM COMPLETE Result Date: 10/05/2024    ECHOCARDIOGRAM REPORT   Patient Name:   NANCY ARVIN Date of Exam: 10/05/2024 Medical Rec #:  [id number]     Height:       64.5 in Accession #:    [id number]    Weight:       137.0 lb Date of Birth:  09/21/1956    BSA:          1.675 m Patient Age:    68 years      BP:           119/71 mmHg Patient Gender: F             HR:           63 bpm. Exam Location:  Inpatient Procedure: 2D Echo, 3D Echo, Cardiac Doppler and Color Doppler (Both Spectral            and Color Flow Doppler were utilized during procedure). Indications:    Chest Pain R07.9  History:        Patient has no prior history of Echocardiogram examinations.                 Elevated troponin, hx of thoracic AA, Signs/Symptoms:Chest Pain;                 Risk Factors:Dyslipidemia.  Sonographer:    Koleen Popper RDCS Referring Phys: [id number] VISHAL R PATEL IMPRESSIONS  1. Left ventricular ejection fraction, by estimation, is 45 to 50%. The left ventricle has mildly decreased function. The left ventricle demonstrates regional wall motion abnormalities (see scoring diagram/findings for description). The left ventricular  internal  cavity size was mildly dilated. There is mild concentric left ventricular hypertrophy. Left ventricular diastolic parameters were normal.  2. Right ventricular systolic function is normal. The right ventricular size is normal. There is normal pulmonary artery systolic pressure.  3. A small pericardial effusion is present. The pericardial effusion is anterior to the right ventricle. There is no evidence of cardiac tamponade.  4. The mitral valve is degenerative. Trivial mitral valve regurgitation. No evidence of mitral stenosis.  5. The tricuspid valve is degenerative.  6. The aortic valve is tricuspid. Aortic valve regurgitation is moderate. Aortic valve sclerosis is present, with no evidence of aortic valve stenosis.  7. Aortic dilatation noted. There is moderate dilatation of the ascending aorta, measuring 44 mm.  8. The inferior vena cava is normal in size with greater than 50% respiratory variability, suggesting right atrial pressure of 3 mmHg. Comparison(s): No prior Echocardiogram. FINDINGS  Left Ventricle: Left ventricular ejection fraction, by estimation, is 45 to 50%. The left ventricle has mildly decreased function. The left ventricle demonstrates regional wall motion abnormalities. 3D ejection fraction reviewed and evaluated as part of  the interpretation. Alternate measurement of EF is felt to be most reflective of LV function. The left ventricular internal cavity size was mildly dilated. There is mild concentric left ventricular hypertrophy. Left ventricular diastolic parameters were  normal.  LV Wall Scoring: The anterior septum is akinetic. The inferior septum and entire inferior wall are hypokinetic. Right Ventricle: The right ventricular size is normal. No increase in right ventricular wall thickness. Right ventricular systolic function is normal. There is normal pulmonary artery systolic pressure. The tricuspid regurgitant velocity is 2.28 m/s, and  with an assumed right atrial pressure of 3 mmHg,  the estimated right  ventricular systolic pressure is 23.8 mmHg. Left Atrium: Left atrial size was normal in size. Right Atrium: Right atrial size was not assessed. Pericardium: A small pericardial effusion is present. The pericardial effusion is anterior to the right ventricle. There is no evidence of cardiac tamponade. Mitral Valve: The mitral valve is degenerative in appearance. There is moderate thickening of the mitral valve leaflet(s). Trivial mitral valve regurgitation. No evidence of mitral valve stenosis. Tricuspid Valve: The tricuspid valve is degenerative in appearance. Tricuspid valve regurgitation is mild . No evidence of tricuspid stenosis. Aortic Valve: The aortic valve is tricuspid. Aortic valve regurgitation is moderate. PHT 430 ms. Aortic valve sclerosis is present, with no evidence of aortic valve stenosis. Pulmonic Valve: The pulmonic valve was not well visualized. Pulmonic valve regurgitation is not visualized. No evidence of pulmonic stenosis. Aorta: The aortic root is normal in size and structure and aortic dilatation noted. There is moderate dilatation of the ascending aorta, measuring 44 mm. Venous: The inferior vena cava is normal in size with greater than 50% respiratory variability, suggesting right atrial pressure of 3 mmHg. IAS/Shunts: No atrial level shunt detected by color flow Doppler. Additional Comments: 3D was performed not requiring image post processing on an independent workstation and was normal.  LEFT VENTRICLE PLAX 2D LVIDd:         4.90 cm LVIDs:         2.70 cm LV PW:         1.10 cm LV IVS:        1.10 cm                             3D Volume EF:                             3D EF:        64 % LV Volumes (MOD) LV vol d, MOD A4C: 153.0 ml LV vol s, MOD A4C: 58.9 ml LV SV MOD A4C:     153.0 ml RIGHT VENTRICLE TAPSE (M-mode): 1.5 cm LEFT ATRIUM           Index LA diam:      3.83 cm 2.29 cm/m LA Vol (A2C): 39.7 ml 23.71 ml/m LA Vol (A4C): 32.2 ml 19.23 ml/m  AORTIC VALVE  LVOT Vmax:         104.00 cm/s LVOT Vmean:        66.800 cm/s LVOT VTI:          0.210 m AR Vena Contracta: 0.40 cm MITRAL VALVE              TRICUSPID VALVE MV Area (PHT): 3.99 cm   TR Peak grad:   20.8 mmHg MV Decel Time: 190 msec   TR Vmax:        228.00 cm/s MR Peak grad: 69.9 mmHg MR Vmax:      418.00 cm/s SHUNTS                           Systemic VTI: 0.21 m Emeline Calender Electronically signed by Emeline Calender Signature Date/Time: 10/05/2024/3:56:13 PM    Final     Cardiac Studies   TTE 10/05/24:  IMPRESSIONS     1. Left ventricular ejection fraction, by estimation, is 45 to 50%. The  left ventricle has mildly decreased function. The left ventricle  demonstrates  regional wall motion abnormalities (see scoring  diagram/findings for description). The left ventricular   internal cavity size was mildly dilated. There is mild concentric left  ventricular hypertrophy. Left ventricular diastolic parameters were  normal.   2. Right ventricular systolic function is normal. The right ventricular  size is normal. There is normal pulmonary artery systolic pressure.   3. A small pericardial effusion is present. The pericardial effusion is  anterior to the right ventricle. There is no evidence of cardiac  tamponade.   4. The mitral valve is degenerative. Trivial mitral valve regurgitation.  No evidence of mitral stenosis.   5. The tricuspid valve is degenerative.   6. The aortic valve is tricuspid. Aortic valve regurgitation is moderate.  Aortic valve sclerosis is present, with no evidence of aortic valve  stenosis.   7. Aortic dilatation noted. There is moderate dilatation of the ascending  aorta, measuring 44 mm.   8. The inferior vena cava is normal in size with greater than 50%  respiratory variability, suggesting right atrial pressure of 3 mmHg.   Patient Profile   Letetia Romanello is a 68 y.o. female with a hx of hyperlipidemia and thoracic aortic aneurysm who is being seen 10/04/2024 for  the evaluation of epigastric pain at the request of Dr. Tobie.   Assessment & Plan    #Possible NSTEMI #Abdominal Pain, resolved #Nausea/Vomiting, Resolved - Patient presented with ongoing nausea, vomiting, and abdominal pain. - CT imaging was notable for duodenitis as a possible etiology. -On admission however, troponins were elevated so the picture of whether or not this was ACS versus a primary GI issue was unclear. - A TTE was performed yesterday which demonstrates focal wall motion abnormalities involving the anteroseptum and apex concerning for a possible LAD territory lesion. - Even though the patient is asymptomatic, given that she has focal wall motion abnormalities, mildly reduced LVEF, and symptoms that could be anginal equivalent particularly in a female, I am inclined to treat this patient as if she is having ACS. - I explained the results with the patient and her husband and they are agreeable to stay and undergo LHC on Monday. Plan for [facility] Monday 11/17 N.p.o. midnight Continue heparin per pharmacy Continue aspirin 81 mg daily   #Mildly Reduced LV Systolic Function - TTE read demonstrates LVEF ranging between 45 to 50%. - I independently reviewed the images myself and I think her EF is closer to 50%. - I we will start her on a low-dose beta-blocker for now, but may ultimately be added discontinue pending catheterization results. - She should probably on a beta-blocker anyway given her TAA. Continue metoprolol to tartrate 12.5 mg twice daily  #HLD Continue atorvastatin 40 mg daily Lipid panel as above        For questions or updates, please contact [facility] Please consult [email or web address] for contact info under       Signed, Georganna Archer, MD  10/07/2024, 12:30 PM

## 2024-10-08 ENCOUNTER — Inpatient Hospital Stay (HOSPITAL_COMMUNITY)
Admission: EM | Disposition: A | Discharge status: Home / Self Care | Payer: Self-pay | Source: Home / Self Care | Attending: Internal Medicine

## 2024-10-08 ENCOUNTER — Encounter (HOSPITAL_COMMUNITY): Payer: Self-pay | Admitting: Internal Medicine

## 2024-10-08 DIAGNOSIS — I25110 Atherosclerotic heart disease of native coronary artery with unstable angina pectoris: Secondary | ICD-10-CM | POA: Diagnosis not present

## 2024-10-08 DIAGNOSIS — R7989 Other specified abnormal findings of blood chemistry: Secondary | ICD-10-CM | POA: Diagnosis not present

## 2024-10-08 DIAGNOSIS — K2980 Duodenitis without bleeding: Secondary | ICD-10-CM | POA: Diagnosis not present

## 2024-10-08 HISTORY — PX: LEFT HEART CATH AND CORONARY ANGIOGRAPHY: CATH118249

## 2024-10-08 LAB — BASIC METABOLIC PANEL WITH GFR
Anion gap: 11 (ref 5–15)
BUN: 15 mg/dL (ref 8–23)
CO2: 24 mmol/L (ref 22–32)
Calcium: 9 mg/dL (ref 8.9–10.3)
Chloride: 101 mmol/L (ref 98–111)
Creatinine, Ser: 0.86 mg/dL (ref 0.44–1.00)
GFR, Estimated: >60 mL/min (ref >60)
Glucose, Bld: 95 mg/dL (ref 70–99)
Potassium: 3.4 mmol/L — ABNORMAL LOW (ref 3.5–5.1)
Sodium: 136 mmol/L (ref 135–145)

## 2024-10-08 LAB — CBC
HCT: 42.7 % (ref 36.0–46.0)
Hemoglobin: 14 g/dL (ref 12.0–15.0)
MCH: 31.6 pg (ref 26.0–34.0)
MCHC: 32.8 g/dL (ref 30.0–36.0)
MCV: 96.4 fL (ref 80.0–100.0)
Platelets: 249 K/uL (ref 150–400)
RBC: 4.43 MIL/uL (ref 3.87–5.11)
RDW: 11.7 % (ref 11.5–15.5)
WBC: 6.6 K/uL (ref 4.0–10.5)
nRBC: 0 % (ref 0.0–0.2)

## 2024-10-08 LAB — MAGNESIUM: Magnesium: 2 mg/dL (ref 1.7–2.4)

## 2024-10-08 LAB — HEPARIN LEVEL (UNFRACTIONATED): Heparin Unfractionated: 0.37 [IU]/mL (ref 0.30–0.70)

## 2024-10-08 MED ORDER — HEPARIN (PORCINE) IN NACL 1000-0.9 UT/500ML-% IV SOLN
INTRAVENOUS | Status: DC | PRN
  Administered 2024-10-08 (×2): 500 mL

## 2024-10-08 MED ORDER — MIDAZOLAM HCL (PF) 2 MG/2ML IJ SOLN
INTRAMUSCULAR | Status: DC | PRN
  Administered 2024-10-08: 1 mg via INTRAVENOUS

## 2024-10-08 MED ORDER — MIDAZOLAM HCL 2 MG/2ML IJ SOLN
INTRAMUSCULAR | Status: AC
Start: 2024-10-08 — End: 2024-10-08
  Filled 2024-10-08: qty 2

## 2024-10-08 MED ORDER — ENOXAPARIN SODIUM 40 MG/0.4ML IJ SOSY: 40.0000 mg | PREFILLED_SYRINGE | INTRAMUSCULAR | Status: DC

## 2024-10-08 MED ORDER — FENTANYL CITRATE (PF) 100 MCG/2ML IJ SOLN
INTRAMUSCULAR | Status: DC | PRN
  Administered 2024-10-08: 25 ug via INTRAVENOUS

## 2024-10-08 MED ORDER — IOHEXOL 350 MG/ML SOLN
INTRAVENOUS | Status: DC | PRN
Start: 2024-10-08 — End: 2024-10-08
  Administered 2024-10-08: 40 mL

## 2024-10-08 MED ORDER — SODIUM CHLORIDE 0.9% FLUSH: 3.0000 mL | Freq: Two times a day (BID) | INTRAVENOUS | Status: DC

## 2024-10-08 MED ORDER — LIDOCAINE HCL (PF) 1 % IJ SOLN
INTRAMUSCULAR | Status: AC
  Filled 2024-10-08: qty 30

## 2024-10-08 MED ORDER — LIDOCAINE HCL (PF) 1 % IJ SOLN
INTRAMUSCULAR | Status: DC | PRN
Start: 2024-10-08 — End: 2024-10-08
  Administered 2024-10-08: 2 mL via INTRADERMAL

## 2024-10-08 MED ORDER — ASPIRIN 81 MG PO TBEC: 81.0000 mg | DELAYED_RELEASE_TABLET | Freq: Every day | ORAL | 0 refills | Status: AC

## 2024-10-08 MED ORDER — FENTANYL CITRATE (PF) 100 MCG/2ML IJ SOLN
INTRAMUSCULAR | Status: AC
  Filled 2024-10-08: qty 2

## 2024-10-08 MED ORDER — HYDRALAZINE HCL 20 MG/ML IJ SOLN: 10.0000 mg | INTRAMUSCULAR | Status: DC | PRN

## 2024-10-08 MED ORDER — FREE WATER: 500.0000 mL | Freq: Once | Status: DC

## 2024-10-08 MED ORDER — POTASSIUM CHLORIDE 10 MEQ/100ML IV SOLN: 10.0000 meq | INTRAVENOUS | Status: DC

## 2024-10-08 MED ORDER — POTASSIUM CHLORIDE CRYS ER 20 MEQ PO TBCR
40.0000 meq | EXTENDED_RELEASE_TABLET | Freq: Once | ORAL | Status: AC
  Administered 2024-10-08: 40 meq via ORAL
  Filled 2024-10-08: qty 2

## 2024-10-08 MED ORDER — NITROGLYCERIN 0.4 MG SL SUBL: 0.4000 mg | SUBLINGUAL_TABLET | SUBLINGUAL | 0 refills | Status: AC | PRN

## 2024-10-08 MED ORDER — HEPARIN SODIUM (PORCINE) 1000 UNIT/ML IJ SOLN
INTRAMUSCULAR | Status: AC
Start: 2024-10-08 — End: 2024-10-08
  Filled 2024-10-08: qty 10

## 2024-10-08 MED ORDER — SODIUM CHLORIDE 0.9% FLUSH: 3.0000 mL | INTRAVENOUS | Status: DC | PRN

## 2024-10-08 MED ORDER — HEPARIN SODIUM (PORCINE) 1000 UNIT/ML IJ SOLN
INTRAMUSCULAR | Status: DC | PRN
  Administered 2024-10-08: 3000 [IU] via INTRAVENOUS

## 2024-10-08 MED ORDER — METOPROLOL TARTRATE 25 MG PO TABS: 12.5000 mg | ORAL_TABLET | Freq: Two times a day (BID) | ORAL | 0 refills | Status: DC

## 2024-10-08 MED ORDER — SODIUM CHLORIDE 0.9 % IV SOLN: 250.0000 mL | INTRAVENOUS | Status: DC | PRN

## 2024-10-08 MED ORDER — VERAPAMIL HCL 2.5 MG/ML IV SOLN
INTRAVENOUS | Status: AC
Start: 2024-10-08 — End: 2024-10-08
  Filled 2024-10-08: qty 2

## 2024-10-08 NOTE — Plan of Care (Signed)
   Problem: Education: Goal: Knowledge of General Education information will improve Description Including pain rating scale, medication(s)/side effects and non-pharmacologic comfort measures Outcome: Progressing   Problem: Health Behavior/Discharge Planning: Goal: Ability to manage health-related needs will improve Outcome: Progressing

## 2024-10-08 NOTE — H&P (View-Only) (Signed)
 Rounding Note   Patient Name: Lindsey Cooper Date of Encounter: 10/08/2024  [facility] Cardiologist: Dr Ren Ny  Subjective No CP or dyspnea  Scheduled Meds:  amitriptyline  10 mg Oral Q0600   aspirin EC  81 mg Oral Daily   atorvastatin  40 mg Oral Daily   metoprolol tartrate  12.5 mg Oral BID   pantoprazole  40 mg Oral BID   sodium chloride flush  3 mL Intravenous Q12H   Continuous Infusions:  heparin 650 Units/hr (10/08/24 0036)   PRN Meds: acetaminophen **OR** acetaminophen, HYDROcodone-acetaminophen, morphine injection, ondansetron **OR** ondansetron (ZOFRAN) IV, senna-docusate   Vital Signs  Vitals:   10/07/24 2044 10/07/24 2152 10/08/24 0345 10/08/24 0732  BP: (!) 110/52  (!) 109/49 124/65  Pulse: (!) 58 64 (!) 58 (!) 46  Resp:    18  Temp: 98 F (36.7 C)  97.6 F (36.4 C) 97.9 F (36.6 C)  TempSrc: Oral  Oral   SpO2: 98%  97% 97%  Weight:      Height:        Intake/Output Summary (Last 24 hours) at 10/08/2024 0839 Last data filed at 10/08/2024 0404 Gross per 24 hour  Intake 1169.6 ml  Output --  Net 1169.6 ml      10/04/2024    1:00 PM 09/20/2023   10:08 AM  Last 3 Weights  Weight (lbs) 137 lb 135 lb 9.6 oz  Weight (kg) 62.143 kg 61.508 kg      Telemetry Sinus - Personally Reviewed    Physical Exam  GEN: No acute distress.   Neck: No JVD Cardiac: RRR, no murmurs, rubs, or gallops.  Respiratory: Clear to auscultation bilaterally. GI: Soft, nontender, non-distended  MS: No edema Neuro:  Nonfocal  Psych: Normal affect   Labs High Sensitivity Troponin:   Recent Labs  Lab 10/06/24 1045  TROPONINIHS 33*     Chemistry Recent Labs  Lab 10/04/24 0952 10/05/24 0237 10/07/24 0710 10/08/24 0453  NA 140 141  --  136  K 4.4 4.1  --  3.4*  CL 105 108  --  101  CO2 24 23  --  24  GLUCOSE 104* 93  --  95  BUN 10 7*  --  15  CREATININE 0.68 0.74  --  0.86  CALCIUM 9.9 8.4*  --  9.0  MG  --   --  2.3 2.0  PROT 7.2   --   --   --   ALBUMIN 4.1  --   --   --   AST 19  --   --   --   ALT 16  --   --   --   ALKPHOS 78  --   --   --   BILITOT 0.5  --   --   --   GFRNONAA >60 >60  --  >60  ANIONGAP 11 10  --  11    Lipids  Recent Labs  Lab 10/07/24 0710  CHOL 164  TRIG 71  HDL 48  LDLCALC 102*  CHOLHDL 3.4    Hematology Recent Labs  Lab 10/06/24 2059 10/07/24 0710 10/08/24 0453  WBC 6.6 3.9* 6.6  RBC 4.45 4.19 4.43  HGB 14.2 13.5 14.0  HCT 41.8 39.4 42.7  MCV 93.9 94.0 96.4  MCH 31.9 32.2 31.6  MCHC 34.0 34.3 32.8  RDW 11.8 11.9 11.7  PLT 240 220 249   Thyroid   Recent Labs  Lab 10/07/24 0710  TSH 0.193*  BNP Recent Labs  Lab 10/06/24 1045  BNP 141.9*    DDimer  Recent Labs  Lab 10/04/24 1009  DDIMER 1.31*     Patient Profile   Lindsey Cooper is a 68 y.o. female with a hx of hyperlipidemia and thoracic aortic aneurysm who is being seen 10/04/2024 for the evaluation of epigastric pain.  CTA showed aneurysmal dilatation of the ascending aorta at 46 mm.  Echocardiogram shows ejection fraction 45 to 50%, akinetic anterior septum and hypokinesis of the inferior septum and inferior wall, mild left ventricular enlargement, mild left ventricular hypertrophy, small pericardial effusion, moderate aortic insufficiency, mildly dilated ascending aorta at 44 mm.  Abdominal CT showed possible duodenitis.  Assessment & Plan  1 possible non-ST elevation myocardial infarction-patient admitted with abdominal pain and CT suggested possible duodenitis.  Echocardiogram showed wall motion abnormality and mildly reduced LV function.  She is felt to potentially have a non-ST elevation myocardial infarction (peak troponin 95) and cardiac catheterization has been scheduled today.  The risk and benefits including myocardial infarction, CVA and death discussed and she agrees to proceed.  Continue aspirin and heparin.  2 cardiomyopathy-mild LV dysfunction on echocardiogram.  Continue low-dose  beta-blocker.  3 hyperlipidemia-continue statin.  4 thoracic aortic aneurysm-will need follow-up CTA May 2026.  For questions or updates, please contact [facility] Please consult [email or web address] for contact info under     Signed, Redell Shallow, MD  10/08/2024, 8:39 AM

## 2024-10-08 NOTE — Progress Notes (Signed)
 ANTICOAGULATION CONSULT NOTE  Pharmacy Consult for heparin Indication: chest pain/ACS  Allergies  Allergen Reactions   Penicillins Other (See Comments)    Other reaction(s): YEAST INFECTION    Patient Measurements: Height: 5' 4.5 (163.8 cm) Weight: 62.1 kg (137 lb) IBW/kg (Calculated) : 55.85 Heparin Dosing Weight: 62 kg   Vital Signs: Temp: 97.9 F (36.6 C) (11/17 0732) Temp Source: Oral (11/17 0345) BP: 124/65 (11/17 0732) Pulse Rate: 46 (11/17 0732)  Labs: Recent Labs    10/06/24 1045 10/06/24 2059 10/06/24 2059 10/07/24 0710 10/07/24 1429 10/08/24 0453  HGB  --  14.2   < > 13.5  --  14.0  HCT  --  41.8  --  39.4  --  42.7  PLT  --  240  --  220  --  249  HEPARINUNFRC  --  0.98*   < > 0.52 0.34 0.37  CREATININE  --   --   --   --   --  0.86  TROPONINIHS 33*  --   --   --   --   --    < > = values in this interval not displayed.    Estimated Creatinine Clearance: 54.5 mL/min (by C-G formula based on SCr of 0.86 mg/dL).  PTA Anticoagulation: none  Assessment: 41 yoF presented to ED 11/13 with abdominal pain found to have elevated troponins/d-dimer and started on heparin IV in the ED which was stopped by cardiology with low concern for ACS.  Pt remains asymptomatic but with new echo findings (LVEF 45%, focal wall motion abnormalities), restarting heparin for empiric ACS and plan for cath on Monday 11/17.   Heparin level 0.37 is therapeutic at 650 units/hr. No s/sx bleeding or infusion issues noted. Hgb 13-14s, pltc normal, stable.   Goal of Therapy:  Heparin level 0.3-0.7 units/ml Monitor platelets by anticoagulation protocol: Yes   Plan:  Continue heparin drip at 650 units/hr  Daily CBC, anti-Xa level Monitor for s/sx of bleeding F/u after LHC today  Thank you for involving pharmacy in this patient's care.  Delon Sax, PharmD, BCPS Clinical Pharmacist Clinical phone for 10/08/2024 is [phone] 10/08/2024 9:38 AM

## 2024-10-08 NOTE — Discharge Summary (Signed)
 Physician Discharge Summary  Lindsey Cooper FMW:[id number] DOB: Jul 21, 1956  PCP: Cleotilde Planas, MD  Admitted from: Home Discharged to: Home  Admit date: 10/04/2024 Discharge date: 10/08/2024  Recommendations for Outpatient Follow-up:    Follow-up Information     Cleotilde Planas, MD. Schedule an appointment as soon as possible for a visit in 1 week(s).   Specialty: Family Medicine Why: To be seen with repeat labs (CBC & BMP). Contact information: [address] [place] [address] [phone]                  Home Health: None    Equipment/Devices: None    Discharge Condition: Improved and stable.    Code Status: Full Code Diet recommendation:  Discharge Diet Orders (From admission, onward)     Start     Ordered   10/05/24 0000  Diet - low sodium heart healthy        10/05/24 1200             Discharge Diagnoses:  Principal Problem:   Duodenitis Active Problems:   Thoracic aortic aneurysm without rupture   Elevated troponin   Hyperlipidemia   Epigastric pain   Non-ST elevation (NSTEMI) myocardial infarction [facility])   Brief Summary: 68 year old female with medical history significant for HLD, ascending thoracic aortic aneurysm, presented to the ED with complaints of new onset epigastric abdominal pain with nausea and vomiting that started on 09/29/2024.  She thought that she might have caught a stomach bug.  She denied eating out or eating anything unusual.  She took some Pepto-Bismol and antacid with some mild relief.  She felt better the next day but by the day after, she had recurrent abdominal pain but no further nausea or vomiting.  She did have some dry heaves.  She reported normal BMs without diarrhea.  No fever, chills or diaphoresis.  Through the entire course, no chest pain, dyspnea or dizziness.   She was reportedly seen in the [facility] on 11/13 and she was sent to ED for further evaluation.  She denies any new medications or NSAID  use.   Since hospital admission, her vital signs have been stable overall.  CMP and CBC were unremarkable.  Serial high-sensitivity troponin T mildly elevated with downward trend: 95 > 81.  Then Troponin I, High Sensitivity: 33. D-dimer elevated at 1.31.  CTA chest PE protocol and CT A/P, showed no pulmonary embolism or acute disease.  Which showed aneurysmal dilatation of ascending thoracic aorta to 4.5 cm.  It also showed duodenitis involving the pylorus and 1st and 2nd portions of the duodenum, with trace inflammatory stranding in the adjacent retroperitoneal fat.  No evidence of pancreatitis.   In the ED she was treated with a liter of IV fluid bolus, maintenance IV fluids, briefly started on IV heparin drip, IV Protonix, IV Zofran, IV morphine.  Cardiology was consulted.  Workup revealed echo with EF of 45 to 50% with LV wall motion abnormalities.  They were concerned about NSTEMI.  She was started on aspirin, low-dose beta-blockers, IV heparin drip with plans for LHC.     Assessment and plan:   Epigastric pain/duodenitis: Etiology unclear.  No NSAID use, alcohol or tobacco use, eating anything unusual or eating out recently.  Of note, she is on Fosamax-follow-up with PCP regarding continuation or holding temporarily.  She is asymptomatic of abdominal pain, nausea or vomiting today.  Continue Protonix 40 mg daily x 1 month, close outpatient follow-up with with PCP and  could consider outpatient GI consultation if patient has recurrence of symptoms or do not resolve.   Elevated troponins secondary to demand ischemia: EKG showed nonspecific findings.  CTA negative for PE.  Cardiology initially were not concerned for ACS and stopped heparin drip in ED. however subsequent 2D echo showed LVEF of 45-50% with LV wall motion abnormalities.  Cardiology could not rule out NSTEMI with certainty and were concerned about this.  She was started on aspirin, low-dose beta-blockers, continued on statins and started  back on IV heparin drip pending LHC.  For the last 3 days, she remained asymptomatic without epigastric abdominal pain, chest pain, nausea or vomiting.   10/08/2024: Underwent LHC by Dr. Lonni End.  Formal report is not published yet.  Per his communication, cath showed minimal, nonobstructive CAD.  LAD with probable 40 to 50% stenosis.  He feels that this is likely demand ischemia from duodenitis and not NSTEMI.  Patient is keen on discharging home.  He recommends that patient can be discharged this evening after completion of post-cath recovery on aspirin 81 mg daily, metoprolol 12.5 mg twice daily, Atorvastatin 40 mg daily and as needed NTG (Per Dr. Mady, just before the cath, she recounted a feeling of swirling between her breasts accompanied by nausea that brought her to the ER).  He recommends that if she has typical chest pain that she can use the NTG. Their office will arrange outpatient follow-up in a few weeks.   Thoracic aortic aneurysm: Ascending aorta 4.5 cm.  As per recommendations made below, needs 54-month interval surveillance imaging.  Patient is aware of the diagnosis and indicates that her family physician follows this.  Continue statins & Aspirin.   Hyperlipidemia: Continue home dose of statins.  LDL 102, may need a second agent?  Zetia.  Defer to outpatient Cardiology.   Hypokalemia: Potassium was p.o. replaced this morning.  Outpatient follow-up.  Abnormal TSH: 11/16: TSH 0.193.  Clinically euthyroid.  Added repeat TSH, Free T4 and Free T3 to today's labs and these results can be followed up as outpatient.  If still abnormal, could consider repeating an 4 weeks.     Consultations: Cardiology   Procedures: 10/08/2024: LHC by Dr. Mady   Discharge Instructions  Discharge Instructions     Activity as tolerated - No restrictions   Complete by: As directed    Call MD for:  difficulty breathing, headache or visual disturbances   Complete by: As directed    Call MD for:   extreme fatigue   Complete by: As directed    Call MD for:  persistant dizziness or light-headedness   Complete by: As directed    Call MD for:  persistant nausea and vomiting   Complete by: As directed    Call MD for:  severe uncontrolled pain   Complete by: As directed    Diet - low sodium heart healthy   Complete by: As directed         Medication List     TAKE these medications    amitriptyline 10 MG tablet Commonly known as: ELAVIL Take 10 mg by mouth at bedtime.   aspirin EC 81 MG tablet Take 1 tablet (81 mg total) by mouth daily. Swallow whole. Start taking on: October 09, 2024   atorvastatin 40 MG tablet Commonly known as: LIPITOR Take 1 tablet by mouth daily.   estradiol 0.1 MG/GM vaginal cream Commonly known as: ESTRACE Place 1 Applicatorful vaginally as needed.   Fosamax 70 MG tablet  Generic drug: alendronate Take 70 mg by mouth once a week.   Hair Skin & Nails Tabs Take 1 tablet by mouth daily.   Multi For Her 50+ Tabs Take 1 tablet by mouth daily.   metoprolol tartrate 25 MG tablet Commonly known as: LOPRESSOR Take 0.5 tablets (12.5 mg total) by mouth 2 (two) times daily.   nitroGLYCERIN 0.4 MG SL tablet Commonly known as: NITROSTAT Place 1 tablet (0.4 mg total) under the tongue every 5 (five) minutes as needed for chest pain (up to a maximum of 3 tablets in 15 minutes.  If pain persists after 3 tablets, seek immediate medical attention.).   OVER THE COUNTER MEDICATION Take 1 tablet by mouth daily. *vitamin d po, vitamin c po, calcium po, collagen po   pantoprazole 40 MG tablet Commonly known as: Protonix Take 1 tablet (40 mg total) by mouth daily.   rizatriptan 10 MG tablet Commonly known as: MAXALT Take 1 tablet by mouth as needed.       Allergies  Allergen Reactions   Penicillins Other (See Comments)    Other reaction(s): YEAST INFECTION      Procedures/Studies: CARDIAC CATHETERIZATION Result Date: 10/08/2024 Conclusions:  Moderate single-vessel coronary artery disease with eccentric 50% proximal LAD stenosis seen only in select frames and caudal views (virtual FFR = 0.85).  No significant disease observed in the LCx or RCA.  Suspect elevated troponin in the setting of acute duodenitis was due to supply-demand mismatch. Normal left ventricular filling pressure. Recommendations: Aggressive secondary prevention of coronary artery disease, including high intensity statin therapy and aspirin 81 mg daily. Lonni Hanson, MD [facility]  ECHOCARDIOGRAM COMPLETE Result Date: 10/05/2024    ECHOCARDIOGRAM REPORT   Patient Name:   Lindsey Cooper Date of Exam: 10/05/2024 Medical Rec #:  [id number]     Height:       64.5 in Accession #:    [id number]    Weight:       137.0 lb Date of Birth:  08-16-1956    BSA:          1.675 m Patient Age:    67 years      BP:           119/71 mmHg Patient Gender: F             HR:           63 bpm. Exam Location:  Inpatient Procedure: 2D Echo, 3D Echo, Cardiac Doppler and Color Doppler (Both Spectral            and Color Flow Doppler were utilized during procedure). Indications:    Chest Pain R07.9  History:        Patient has no prior history of Echocardiogram examinations.                 Elevated troponin, hx of thoracic AA, Signs/Symptoms:Chest Pain;                 Risk Factors:Dyslipidemia.  Sonographer:    Koleen Popper RDCS Referring Phys: [id number] VISHAL R PATEL IMPRESSIONS  1. Left ventricular ejection fraction, by estimation, is 45 to 50%. The left ventricle has mildly decreased function. The left ventricle demonstrates regional wall motion abnormalities (see scoring diagram/findings for description). The left ventricular  internal cavity size was mildly dilated. There is mild concentric left ventricular hypertrophy. Left ventricular diastolic parameters were normal.  2. Right ventricular systolic function is normal. The right ventricular size is normal. There is normal  pulmonary artery  systolic pressure.  3. A small pericardial effusion is present. The pericardial effusion is anterior to the right ventricle. There is no evidence of cardiac tamponade.  4. The mitral valve is degenerative. Trivial mitral valve regurgitation. No evidence of mitral stenosis.  5. The tricuspid valve is degenerative.  6. The aortic valve is tricuspid. Aortic valve regurgitation is moderate. Aortic valve sclerosis is present, with no evidence of aortic valve stenosis.  7. Aortic dilatation noted. There is moderate dilatation of the ascending aorta, measuring 44 mm.  8. The inferior vena cava is normal in size with greater than 50% respiratory variability, suggesting right atrial pressure of 3 mmHg. Comparison(s): No prior Echocardiogram. FINDINGS  Left Ventricle: Left ventricular ejection fraction, by estimation, is 45 to 50%. The left ventricle has mildly decreased function. The left ventricle demonstrates regional wall motion abnormalities. 3D ejection fraction reviewed and evaluated as part of  the interpretation. Alternate measurement of EF is felt to be most reflective of LV function. The left ventricular internal cavity size was mildly dilated. There is mild concentric left ventricular hypertrophy. Left ventricular diastolic parameters were  normal.  LV Wall Scoring: The anterior septum is akinetic. The inferior septum and entire inferior wall are hypokinetic. Right Ventricle: The right ventricular size is normal. No increase in right ventricular wall thickness. Right ventricular systolic function is normal. There is normal pulmonary artery systolic pressure. The tricuspid regurgitant velocity is 2.28 m/s, and  with an assumed right atrial pressure of 3 mmHg, the estimated right ventricular systolic pressure is 23.8 mmHg. Left Atrium: Left atrial size was normal in size. Right Atrium: Right atrial size was not assessed. Pericardium: A small pericardial effusion is present. The pericardial effusion is anterior to  the right ventricle. There is no evidence of cardiac tamponade. Mitral Valve: The mitral valve is degenerative in appearance. There is moderate thickening of the mitral valve leaflet(s). Trivial mitral valve regurgitation. No evidence of mitral valve stenosis. Tricuspid Valve: The tricuspid valve is degenerative in appearance. Tricuspid valve regurgitation is mild . No evidence of tricuspid stenosis. Aortic Valve: The aortic valve is tricuspid. Aortic valve regurgitation is moderate. PHT 430 ms. Aortic valve sclerosis is present, with no evidence of aortic valve stenosis. Pulmonic Valve: The pulmonic valve was not well visualized. Pulmonic valve regurgitation is not visualized. No evidence of pulmonic stenosis. Aorta: The aortic root is normal in size and structure and aortic dilatation noted. There is moderate dilatation of the ascending aorta, measuring 44 mm. Venous: The inferior vena cava is normal in size with greater than 50% respiratory variability, suggesting right atrial pressure of 3 mmHg. IAS/Shunts: No atrial level shunt detected by color flow Doppler. Additional Comments: 3D was performed not requiring image post processing on an independent workstation and was normal.  LEFT VENTRICLE PLAX 2D LVIDd:         4.90 cm LVIDs:         2.70 cm LV PW:         1.10 cm LV IVS:        1.10 cm                             3D Volume EF:                             3D EF:        64 % LV  Volumes (MOD) LV vol d, MOD A4C: 153.0 ml LV vol s, MOD A4C: 58.9 ml LV SV MOD A4C:     153.0 ml RIGHT VENTRICLE TAPSE (M-mode): 1.5 cm LEFT ATRIUM           Index LA diam:      3.83 cm 2.29 cm/m LA Vol (A2C): 39.7 ml 23.71 ml/m LA Vol (A4C): 32.2 ml 19.23 ml/m  AORTIC VALVE LVOT Vmax:         104.00 cm/s LVOT Vmean:        66.800 cm/s LVOT VTI:          0.210 m AR Vena Contracta: 0.40 cm MITRAL VALVE              TRICUSPID VALVE MV Area (PHT): 3.99 cm   TR Peak grad:   20.8 mmHg MV Decel Time: 190 msec   TR Vmax:        228.00  cm/s MR Peak grad: 69.9 mmHg MR Vmax:      418.00 cm/s SHUNTS                           Systemic VTI: 0.21 m Emeline Calender Electronically signed by Emeline Calender Signature Date/Time: 10/05/2024/3:56:13 PM    Final    CT Angio Chest PE W and/or Wo Contrast Result Date: 10/04/2024 EXAM: CTA CHEST PE WITH AND WITHOUT AND WITH CONTRAST CT ABDOMEN AND PELVIS WITH AND WITHOUT AND WITH CONTRAST 10/04/2024 11:38:14 AM TECHNIQUE: CTA of the chest was performed after the administration of intravenous contrast. Multiplanar reformatted images are provided for review. MIP images are provided for review. CT of the abdomen and pelvis was performed with and without the administration of intravenous contrast. Automated exposure control, iterative reconstruction, and/or weight based adjustment of the mA/kV was utilized to reduce the radiation dose to as low as reasonably achievable. COMPARISON: None available. CLINICAL HISTORY: Pulmonary embolism (PE) suspected, low to intermediate prob, positive D-dimer. Epigastric pain. FINDINGS: CHEST: PULMONARY ARTERIES: Pulmonary arteries are adequately opacified for evaluation. No intraluminal filling defect to suggest pulmonary embolism. Main pulmonary artery is normal in caliber. MEDIASTINUM: Aneurysmal dilatation of the ascending thoracic aorta to 4.5 cm on coronal image 87 x 5. Aortic arch. No mediastinal lymphadenopathy. The heart and pericardium demonstrate no acute abnormality. LUNGS AND PLEURA: The lungs are without acute process. No pulmonary infarction. No pneumonia. No focal consolidation or pulmonary edema. No pleural fluid. No pneumothorax. SOFT TISSUES AND BONES: No acute bone or soft tissue abnormality. ABDOMEN AND PELVIS: LIVER: Multiple simple fluid attenuation cysts within the left and right hepatic lobes. All about normal. GALLBLADDER AND BILE DUCTS: Gallbladder is normal. No biliary ductal dilatation. SPLEEN: Spleen demonstrates no acute abnormality. PANCREAS: No evidence of  pancreatitis. ADRENAL GLANDS: Adrenal glands demonstrate no acute abnormality. KIDNEYS, URETERS AND BLADDER: No stones in the kidneys or ureters. No hydronephrosis. No perinephric or periureteral stranding. Urinary bladder is unremarkable. GI AND BOWEL: The stomach is normal. There is submucosal edema involving the pylorus and 1st and 2nd portion of the duodenum (imaging 24 of series 4). Trace inflammatory stranding within the retroperitoneal fat along the second portion of the duodenum. Normal appendix. There is no bowel obstruction. No abnormal bowel wall thickening or distension. 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 PERITONEUM AND RETROPERITONEUM: No ascites or free air. LYMPH NODES: No lymphadenopathy. BONES AND SOFT TISSUES: No acute abnormality of the visualized bones. No focal soft  tissue abnormality. IMPRESSION: 1. No evidence of acute pulmonary disease. No acute pulmonary parenchymal findings. 2. Aneurysmal dilatation of the ascending thoracic aorta to 4.5 cm. Recommend 49-month interval surveillance imaging. 3. Duodenitis involving the pylorus and first and second portions of the duodenum, with trace inflammatory stranding in the adjacent retroperitoneal fat. No evidence of pancreatitis. Electronically signed by: Norleen Boxer MD 10/04/2024 12:32 PM EST RP Workstation: [id number]   CT ABDOMEN PELVIS W CONTRAST Result Date: 10/04/2024 EXAM: CTA CHEST PE WITH AND WITHOUT AND WITH CONTRAST CT ABDOMEN AND PELVIS WITH AND WITHOUT AND WITH CONTRAST 10/04/2024 11:38:14 AM TECHNIQUE: CTA of the chest was performed after the administration of intravenous contrast. Multiplanar reformatted images are provided for review. MIP images are provided for review. CT of the abdomen and pelvis was performed with and without the administration of intravenous contrast. Automated exposure control, iterative reconstruction, and/or weight based adjustment of the mA/kV was utilized to  reduce the radiation dose to as low as reasonably achievable. COMPARISON: None available. CLINICAL HISTORY: Pulmonary embolism (PE) suspected, low to intermediate prob, positive D-dimer. Epigastric pain. FINDINGS: CHEST: PULMONARY ARTERIES: Pulmonary arteries are adequately opacified for evaluation. No intraluminal filling defect to suggest pulmonary embolism. Main pulmonary artery is normal in caliber. MEDIASTINUM: Aneurysmal dilatation of the ascending thoracic aorta to 4.5 cm on coronal image 87 x 5. Aortic arch. No mediastinal lymphadenopathy. The heart and pericardium demonstrate no acute abnormality. LUNGS AND PLEURA: The lungs are without acute process. No pulmonary infarction. No pneumonia. No focal consolidation or pulmonary edema. No pleural fluid. No pneumothorax. SOFT TISSUES AND BONES: No acute bone or soft tissue abnormality. ABDOMEN AND PELVIS: LIVER: Multiple simple fluid attenuation cysts within the left and right hepatic lobes. All about normal. GALLBLADDER AND BILE DUCTS: Gallbladder is normal. No biliary ductal dilatation. SPLEEN: Spleen demonstrates no acute abnormality. PANCREAS: No evidence of pancreatitis. ADRENAL GLANDS: Adrenal glands demonstrate no acute abnormality. KIDNEYS, URETERS AND BLADDER: No stones in the kidneys or ureters. No hydronephrosis. No perinephric or periureteral stranding. Urinary bladder is unremarkable. GI AND BOWEL: The stomach is normal. There is submucosal edema involving the pylorus and 1st and 2nd portion of the duodenum (imaging 24 of series 4). Trace inflammatory stranding within the retroperitoneal fat along the second portion of the duodenum. Normal appendix. There is no bowel obstruction. No abnormal bowel wall thickening or distension. 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 PERITONEUM AND RETROPERITONEUM: No ascites or free air. LYMPH NODES: No lymphadenopathy. BONES AND SOFT TISSUES: No acute abnormality of the  visualized bones. No focal soft tissue abnormality. IMPRESSION: 1. No evidence of acute pulmonary disease. No acute pulmonary parenchymal findings. 2. Aneurysmal dilatation of the ascending thoracic aorta to 4.5 cm. Recommend 49-month interval surveillance imaging. 3. Duodenitis involving the pylorus and first and second portions of the duodenum, with trace inflammatory stranding in the adjacent retroperitoneal fat. No evidence of pancreatitis. Electronically signed by: Norleen Boxer MD 10/04/2024 12:32 PM EST RP Workstation: [id number]   DG Chest Port 1 View Result Date: 10/04/2024 CLINICAL DATA:  Chest pain. EXAM: PORTABLE CHEST 1 VIEW COMPARISON:  CTA chest dated 08/22/2018. FINDINGS: The heart size and mediastinal contours are within normal limits. No focal consolidation, pleural effusion, or pneumothorax. Dextroscoliotic curvature of the thoracic spine. IMPRESSION: No acute cardiopulmonary findings. Electronically Signed   By: Harrietta Sherry M.D.   On: 10/04/2024 10:51      Subjective: Since hospital admission, patient has been asymptomatic overall. No epigastric pain. Does  not appear that she ever had chest pain or dyspnea.   Discharge Exam:  Vitals:   10/08/24 1606 10/08/24 1611 10/08/24 1630 10/08/24 1645  BP: 120/68 120/68 122/66 117/80  Pulse: (!) 55 (!) 0 61 70  Resp: 15  (!) 23 20  Temp:   98.1 F (36.7 C)   TempSrc:   Temporal   SpO2: 95%  96% 94%  Weight:      Height:        General: Middle-age female, moderately built and nourished lying comfortably propped up in bed without distress.  Appeared to be in pleasant spirits. Cardiovascular: S1 & S2 heard, RRR, S1/S2 +. No murmurs, rubs, gallops or clicks. No JVD or pedal edema.  Telemetry personally reviewed: Sinus rhythm.  Stable. Respiratory: Clear to auscultation without wheezing, rhonchi or crackles. No increased work of breathing.  Abdominal:  Non distended, non tender & soft. No organomegaly or masses appreciated.  Normal bowel sounds heard. CNS: Alert and oriented. No focal deficits. Extremities: no edema, no cyanosis    The results of significant diagnostics from this hospitalization (including imaging, microbiology, ancillary and laboratory) are listed below for reference.     Microbiology: No results found for this or any previous visit (from the past 240 hours).   Labs: CBC: Recent Labs  Lab 10/04/24 0952 10/05/24 0237 10/06/24 2059 10/07/24 0710 10/08/24 0453  WBC 7.0 6.2 6.6 3.9* 6.6  NEUTROABS 4.7  --   --   --   --   HGB 14.1 12.7 14.2 13.5 14.0  HCT 41.6 38.4 41.8 39.4 42.7  MCV 94.8 97.2 93.9 94.0 96.4  PLT 234 204 240 220 249    Basic Metabolic Panel: Recent Labs  Lab 10/04/24 0952 10/05/24 0237 10/07/24 0710 10/08/24 0453  NA 140 141  --  136  K 4.4 4.1  --  3.4*  CL 105 108  --  101  CO2 24 23  --  24  GLUCOSE 104* 93  --  95  BUN 10 7*  --  15  CREATININE 0.68 0.74  --  0.86  CALCIUM 9.9 8.4*  --  9.0  MG  --   --  2.3 2.0    Liver Function Tests: Recent Labs  Lab 10/04/24 0952  AST 19  ALT 16  ALKPHOS 78  BILITOT 0.5  PROT 7.2  ALBUMIN 4.1    Hgb A1c Recent Labs    10/07/24 0710  HGBA1C 5.5    Lipid Profile Recent Labs    10/07/24 0710  CHOL 164  HDL 48  LDLCALC 102*  TRIG 71  CHOLHDL 3.4    Thyroid  function studies Recent Labs    10/07/24 0710  TSH 0.193*   Discussed with patient's spouse at bedside prior to procedure this morning.   Time coordinating discharge: 25 minutes  SIGNED:  Trenda Mar, MD,  FACP, [facility], [facility], [facility]   [facility] [place]     To contact the attending provider between 7A-7P or the covering provider during after hours 7P-7A, please log into the web site [email or web address] and access using universal [place] password for that web site. If you do not have the password, please call the hospital operator.

## 2024-10-08 NOTE — Interval H&P Note (Signed)
 History and Physical Interval Note:  10/08/2024 3:34 PM  Lindsey Cooper  has presented today for surgery, with the diagnosis of unstable angina and cardiomyopathy.  The various methods of treatment have been discussed with the patient and family. After consideration of risks, benefits and other options for treatment, the patient has consented to  Procedure(s): LEFT HEART CATH AND CORONARY ANGIOGRAPHY (N/A) as a surgical intervention.  The patient's history has been reviewed, patient examined, no change in status, stable for surgery.  I have reviewed the patient's chart and labs.  Questions were answered to the patient's satisfaction.    Cath Lab Visit (complete for each Cath Lab visit)  Clinical Evaluation Leading to the Procedure:   ACS: Yes.    Non-ACS:  N/A  May [place]

## 2024-10-08 NOTE — Plan of Care (Signed)

## 2024-10-08 NOTE — Op Note (Incomplete)
 Pt chg bath done

## 2024-10-08 NOTE — Progress Notes (Signed)
 Rounding Note   Patient Name: Lindsey Cooper Date of Encounter: 10/08/2024  [facility] Cardiologist: Dr Ren Ny  Subjective No CP or dyspnea  Scheduled Meds:  amitriptyline  10 mg Oral Q0600   aspirin EC  81 mg Oral Daily   atorvastatin  40 mg Oral Daily   metoprolol tartrate  12.5 mg Oral BID   pantoprazole  40 mg Oral BID   sodium chloride flush  3 mL Intravenous Q12H   Continuous Infusions:  heparin 650 Units/hr (10/08/24 0036)   PRN Meds: acetaminophen **OR** acetaminophen, HYDROcodone-acetaminophen, morphine injection, ondansetron **OR** ondansetron (ZOFRAN) IV, senna-docusate   Vital Signs  Vitals:   10/07/24 2044 10/07/24 2152 10/08/24 0345 10/08/24 0732  BP: (!) 110/52  (!) 109/49 124/65  Pulse: (!) 58 64 (!) 58 (!) 46  Resp:    18  Temp: 98 F (36.7 C)  97.6 F (36.4 C) 97.9 F (36.6 C)  TempSrc: Oral  Oral   SpO2: 98%  97% 97%  Weight:      Height:        Intake/Output Summary (Last 24 hours) at 10/08/2024 0839 Last data filed at 10/08/2024 0404 Gross per 24 hour  Intake 1169.6 ml  Output --  Net 1169.6 ml      10/04/2024    1:00 PM 09/20/2023   10:08 AM  Last 3 Weights  Weight (lbs) 137 lb 135 lb 9.6 oz  Weight (kg) 62.143 kg 61.508 kg      Telemetry Sinus - Personally Reviewed    Physical Exam  GEN: No acute distress.   Neck: No JVD Cardiac: RRR, no murmurs, rubs, or gallops.  Respiratory: Clear to auscultation bilaterally. GI: Soft, nontender, non-distended  MS: No edema Neuro:  Nonfocal  Psych: Normal affect   Labs High Sensitivity Troponin:   Recent Labs  Lab 10/06/24 1045  TROPONINIHS 33*     Chemistry Recent Labs  Lab 10/04/24 0952 10/05/24 0237 10/07/24 0710 10/08/24 0453  NA 140 141  --  136  K 4.4 4.1  --  3.4*  CL 105 108  --  101  CO2 24 23  --  24  GLUCOSE 104* 93  --  95  BUN 10 7*  --  15  CREATININE 0.68 0.74  --  0.86  CALCIUM 9.9 8.4*  --  9.0  MG  --   --  2.3 2.0  PROT 7.2   --   --   --   ALBUMIN 4.1  --   --   --   AST 19  --   --   --   ALT 16  --   --   --   ALKPHOS 78  --   --   --   BILITOT 0.5  --   --   --   GFRNONAA >60 >60  --  >60  ANIONGAP 11 10  --  11    Lipids  Recent Labs  Lab 10/07/24 0710  CHOL 164  TRIG 71  HDL 48  LDLCALC 102*  CHOLHDL 3.4    Hematology Recent Labs  Lab 10/06/24 2059 10/07/24 0710 10/08/24 0453  WBC 6.6 3.9* 6.6  RBC 4.45 4.19 4.43  HGB 14.2 13.5 14.0  HCT 41.8 39.4 42.7  MCV 93.9 94.0 96.4  MCH 31.9 32.2 31.6  MCHC 34.0 34.3 32.8  RDW 11.8 11.9 11.7  PLT 240 220 249   Thyroid   Recent Labs  Lab 10/07/24 0710  TSH 0.193*  BNP Recent Labs  Lab 10/06/24 1045  BNP 141.9*    DDimer  Recent Labs  Lab 10/04/24 1009  DDIMER 1.31*     Patient Profile   Lindsey Cooper is a 68 y.o. female with a hx of hyperlipidemia and thoracic aortic aneurysm who is being seen 10/04/2024 for the evaluation of epigastric pain.  CTA showed aneurysmal dilatation of the ascending aorta at 46 mm.  Echocardiogram shows ejection fraction 45 to 50%, akinetic anterior septum and hypokinesis of the inferior septum and inferior wall, mild left ventricular enlargement, mild left ventricular hypertrophy, small pericardial effusion, moderate aortic insufficiency, mildly dilated ascending aorta at 44 mm.  Abdominal CT showed possible duodenitis.  Assessment & Plan  1 possible non-ST elevation myocardial infarction-patient admitted with abdominal pain and CT suggested possible duodenitis.  Echocardiogram showed wall motion abnormality and mildly reduced LV function.  She is felt to potentially have a non-ST elevation myocardial infarction (peak troponin 95) and cardiac catheterization has been scheduled today.  The risk and benefits including myocardial infarction, CVA and death discussed and she agrees to proceed.  Continue aspirin and heparin.  2 cardiomyopathy-mild LV dysfunction on echocardiogram.  Continue low-dose  beta-blocker.  3 hyperlipidemia-continue statin.  4 thoracic aortic aneurysm-will need follow-up CTA May 2026.  For questions or updates, please contact [facility] Please consult [email or web address] for contact info under     Signed, Redell Shallow, MD  10/08/2024, 8:39 AM

## 2024-10-08 NOTE — Progress Notes (Signed)
 PROGRESS NOTE   Lindsey Cooper  FMW:[id number]    DOB: [birth year]    DOA: 10/04/2024  PCP: Cleotilde Planas, MD   I have briefly reviewed patients previous medical records in [facility].   Brief Hospital Course:  68 year old female with medical history significant for HLD, ascending thoracic aortic aneurysm, presented to the ED with complaints of new onset epigastric abdominal pain with nausea and vomiting that started on 09/29/2024.  She thought that she might have caught a stomach bug.  She denied eating out or eating anything unusual.  She took some Pepto-Bismol and antacid with some mild relief.  She felt better the next day but by the day after, she had recurrent abdominal pain but no further nausea or vomiting.  She did have some dry heaves.  She reported normal BMs without diarrhea.  No fever, chills or diaphoresis.  Through the entire course, no chest pain, dyspnea or dizziness.   She was reportedly seen in the [place] clinic on 11/13 and she was sent to ED for further evaluation.  She denies any new medications or NSAID use.   Since hospital admission, her vital signs have been stable overall.  CMP and CBC were unremarkable.  Serial high-sensitivity troponin T mildly elevated with downward trend: 95 > 81.  D-dimer elevated at 1.31.  CTA chest PE protocol and CT A/P, showed no pulmonary embolism or acute disease.  Which showed aneurysmal dilatation of ascending thoracic aorta to 4.5 cm.  It also showed duodenitis involving the pylorus and 1st and 2nd portions of the duodenum, with trace inflammatory stranding in the adjacent retroperitoneal fat.  No evidence of pancreatitis.   In the ED she was treated with a liter of IV fluid bolus, maintenance IV fluids, briefly started on IV heparin drip, IV Protonix, IV Zofran, IV morphine.  Cardiology was consulted.  Echo showed LVEF 45-50% with LV regional wall motion abnormalities.  Awaiting LHC.   Assessment & Plan:   Epigastric pain/duodenitis:  Etiology unclear.  No NSAID use, alcohol or tobacco use, eating anything unusual or eating out recently.  Of note, she is on Fosamax-follow-up with PCP regarding continuation or holding temporarily.  She is asymptomatic of abdominal pain, nausea or vomiting today.  Continue Protonix 40 mg daily x 1 month, close outpatient follow-up with with PCP and could consider outpatient GI consultation if patient has recurrence of symptoms or do not resolve.  Stable without recurrence in the hospital.   Elevated troponins/possible NSTEMI: EKG showed nonspecific findings.  CTA negative for PE.  Cardiology was not concerned for ACS and stopped heparin drip in ED. Cardiology consulted. 2D echo results reviewed, LVEF 45-50% with LV regional wall motion abnormalities.  Cardiology following.  Suspect NSTEMI, have restarted aspirin, heparin, on low-dose beta-blockers, plan for LHC on 11/17.  As of now, it does not appear that patient has yet gone for her LHC.  She was an add-on case.  Even if no interventions at [facility], given late hour, it is likely that she will stay overnight.  Follow LHC results.   Thoracic aortic aneurysm: Ascending aorta 4.5 cm.  As per recommendations made below, needs 3-month interval surveillance imaging.  Patient is aware of the diagnosis and indicates that her family physician follows this.  Continue statins.   Hyperlipidemia: Continue home dose of statins.  Hypokalemia: Replaced p.o. this morning.  Follow BMP.   Body mass index is 23.15 kg/m.   DVT prophylaxis:      Code Status: Full Code:  Family Communication: Spouse at bedside this morning. Disposition:  Meets for inpatient intensity based on the NSTEMI, IV heparin, plan for LHC and care will cross 2 midnights.     Consultants:   Cardiology  Procedures:     Subjective:  Seen this morning prior to procedure.  No recurrence of abdominal pain over the weekend.  No chest pain or dyspnea.  Anxious to complete cath and DC home soon.   Kept asking as to when she was scheduled for LHC and the best we could tell her was post noon because she was an add-on case.  Objective:   Vitals:   10/07/24 2044 10/07/24 2152 10/08/24 0345 10/08/24 0732  BP: (!) 110/52  (!) 109/49 124/65  Pulse: (!) 58 64 (!) 58 (!) 46  Resp:    18  Temp: 98 F (36.7 C)  97.6 F (36.4 C) 97.9 F (36.6 C)  TempSrc: Oral  Oral   SpO2: 98%  97% 97%  Weight:      Height:        General: Middle-age female, moderately built and nourished lying comfortably propped up in bed without distress.  Appeared to be in pleasant spirits. Cardiovascular: S1 & S2 heard, RRR, S1/S2 +. No murmurs, rubs, gallops or clicks. No JVD or pedal edema.  Stable. Respiratory: Clear to auscultation without wheezing, rhonchi or crackles. No increased work of breathing.  Telemetry personally reviewed: Sinus rhythm. Abdominal:  Non distended, non tender & soft. No organomegaly or masses appreciated. Normal bowel sounds heard. CNS: Alert and oriented. No focal deficits. Extremities: no edema, no cyanosis    Data Reviewed:   I have personally reviewed following labs and imaging studies   CBC: Recent Labs  Lab 10/04/24 0952 10/05/24 0237 10/06/24 2059 10/07/24 0710 10/08/24 0453  WBC 7.0   < > 6.6 3.9* 6.6  NEUTROABS 4.7  --   --   --   --   HGB 14.1   < > 14.2 13.5 14.0  HCT 41.6   < > 41.8 39.4 42.7  MCV 94.8   < > 93.9 94.0 96.4  PLT 234   < > 240 220 249   < > = values in this interval not displayed.    Basic Metabolic Panel: Recent Labs  Lab 10/04/24 0952 10/05/24 0237 10/07/24 0710 10/08/24 0453  NA 140 141  --  136  K 4.4 4.1  --  3.4*  CL 105 108  --  101  CO2 24 23  --  24  GLUCOSE 104* 93  --  95  BUN 10 7*  --  15  CREATININE 0.68 0.74  --  0.86  CALCIUM 9.9 8.4*  --  9.0  MG  --   --  2.3 2.0    Liver Function Tests: Recent Labs  Lab 10/04/24 0952  AST 19  ALT 16  ALKPHOS 78  BILITOT 0.5  PROT 7.2  ALBUMIN 4.1    CBG: No  results for input(s): GLUCAP in the last 168 hours.  Microbiology Studies:  No results found for this or any previous visit (from the past 240 hours).  Radiology Studies:  No results found.   Scheduled Meds:    amitriptyline  10 mg Oral Q0600   aspirin EC  81 mg Oral Daily   atorvastatin  40 mg Oral Daily   metoprolol tartrate  12.5 mg Oral BID   pantoprazole  40 mg Oral BID   sodium chloride flush  3 mL  Intravenous Q12H    Continuous Infusions:    heparin 650 Units/hr (10/08/24 0036)     LOS: 2 days     Trenda Mar, MD,  FACP, [facility], [facility], [facility]   [facility] [place]      To contact the attending provider between 7A-7P or the covering provider during after hours 7P-7A, please log into the web site [email or web address] and access using universal [place] password for that web site. If you do not have the password, please call the hospital operator.  10/08/2024, 2:13 PM

## 2024-10-09 MED FILL — Verapamil HCl IV Soln 2.5 MG/ML: INTRAVENOUS | Qty: 2 | Status: AC

## 2024-10-16 ENCOUNTER — Encounter: Payer: Self-pay | Admitting: Plastic Surgery

## 2024-10-16 ENCOUNTER — Ambulatory Visit (INDEPENDENT_AMBULATORY_CARE_PROVIDER_SITE_OTHER): Payer: Self-pay | Admitting: Plastic Surgery

## 2024-10-16 DIAGNOSIS — Z719 Counseling, unspecified: Secondary | ICD-10-CM

## 2024-10-16 NOTE — Progress Notes (Signed)

## 2024-10-22 ENCOUNTER — Ambulatory Visit: Attending: Physician Assistant | Admitting: Physician Assistant

## 2024-10-22 ENCOUNTER — Encounter: Payer: Self-pay | Admitting: Physician Assistant

## 2024-10-22 VITALS — BP 122/74 | HR 65 | Ht 64.0 in | Wt 137.4 lb

## 2024-10-22 DIAGNOSIS — I2510 Atherosclerotic heart disease of native coronary artery without angina pectoris: Secondary | ICD-10-CM

## 2024-10-22 DIAGNOSIS — K2980 Duodenitis without bleeding: Secondary | ICD-10-CM | POA: Diagnosis not present

## 2024-10-22 DIAGNOSIS — R931 Abnormal findings on diagnostic imaging of heart and coronary circulation: Secondary | ICD-10-CM | POA: Diagnosis not present

## 2024-10-22 DIAGNOSIS — E785 Hyperlipidemia, unspecified: Secondary | ICD-10-CM

## 2024-10-22 DIAGNOSIS — I7121 Aneurysm of the ascending aorta, without rupture: Secondary | ICD-10-CM

## 2024-10-22 MED ORDER — METOPROLOL SUCCINATE ER 25 MG PO TB24: 25.0000 mg | ORAL_TABLET | Freq: Every day | ORAL | 3 refills | Status: AC

## 2024-10-22 NOTE — Patient Instructions (Signed)
 Medication Instructions:  STOP METOPROLOL  TARTRATE  START METOPROLOL  SUCCINATE 25 MG DAILY  *If you need a refill on your cardiac medications before your next appointment, please call your pharmacy*  Lab Work: FASTING LIPID PANEL AND LIVER FUNCTION TEST IN 4 WEEKS WITH PCP. If you have labs (blood work) drawn today and your tests are completely normal, you will receive your results only by: MyChart Message (if you have MyChart) OR A paper copy in the mail If you have any lab test that is abnormal or we need to change your treatment, we will call you to review the results.  Testing/Procedures: NO TESTING  Follow-Up: At [facility], you and your health needs are our priority.  As part of our continuing mission to provide you with exceptional heart care, our providers are all part of one team.  This team includes your primary Cardiologist (physician) and Advanced Practice Providers or APPs (Physician Assistants and Nurse Practitioners) who all work together to provide you with the care you need, when you need it.  Your next appointment:   6 month(s)  Provider:   Joelle VEAR Ren Donley, MD

## 2024-10-22 NOTE — Progress Notes (Signed)
 Cardiology Office Note   Date:  10/22/2024  ID:  Lindsey Cooper, Lindsey Cooper [birth year], MRN [id number] PCP: Cleotilde Planas, MD  [facility] Providers Cardiologist:  Joelle VEAR Ren Donley, MD     History of Present Illness Lindsey Cooper is a 68 y.o. female with past medical history of hyperlipidemia and a thoracic aortic aneurysm who was recently admitted to the hospital with epigastric pain and found to have elevated troponin and elevated D-dimer.  CTA of the chest and abdomen showed no PE, dilated ascending aorta measuring 4.5 cm, evidence of duodenitis involving the pylorus and the 1st and 2nd portion of the duodenum, no evidence of pancreatitis.  Subsequent echocardiogram obtained on 10/05/2024 showed EF 45 to 50%, mild LVH, small pericardial effusion, wall motion abnormality involving akinetic anterior septum, hypokinesis of the inferior septum and the entire inferior wall.  Due to abnormal echocardiogram, patient ultimately underwent cardiac catheterization on 10/08/2024 that showed moderate single-vessel disease with 50% proximal LAD with negative FFR, no significant disease observed in RCA or left circumflex lesion.  Suspected elevation of the troponin was in the setting of acute duodenitis due to supply/demand mismatch.  Patient was placed on aspirin  and high intensity statin.  Patient presents today for cardiology office evaluation.  Her abdominal pain has completely resolved.  Once she finished her current bottle of Protonix , I think she can stop that medication.  I would recommend she continue on beta-blocker, however we will switch her metoprolol  to tartrate to metoprolol  succinate 25 mg daily.  We reviewed the recent echocardiogram and cath report.  Her new LDL goal should be less than 70.  LDL was 102 in the hospital.  She has been compliant with her atorvastatin  40 mg daily.  She will need a fasting lipid panel and a liver function test in 4 weeks, she preferred to have this done at her  PCPs office.  I will defer to Dr. Cleotilde to order these blood works.  She asked about acetaminophen  and Sudafed.  I am okay with her continuing acetaminophen  from the cardiac perspective.  I would prefer her to limit the amount of Sudafed as it may increase her heart rate.  I recommended alternatives such as Claritin, Zyrtec or Allegra for nasal drainage.  She can follow-up with Dr. Donley in 72-month  ROS:   She denies chest pain, palpitations, dyspnea, pnd, orthopnea, n, v, dizziness, syncope, edema, weight gain, or early satiety. All other systems reviewed and are otherwise negative except as noted above.    Studies Reviewed      Cardiac Studies & Procedures   ______________________________________________________________________________________________ CARDIAC CATHETERIZATION  CARDIAC CATHETERIZATION 10/08/2024  Conclusion Conclusions: Moderate single-vessel coronary artery disease with eccentric 50% proximal LAD stenosis seen only in select frames and caudal views (virtual FFR = 0.85).  No significant disease observed in the LCx or RCA.  Suspect elevated troponin in the setting of acute duodenitis was due to supply-demand mismatch. Normal left ventricular filling pressure.  Recommendations: Aggressive secondary prevention of coronary artery disease, including high intensity statin therapy and aspirin  81 mg daily.  Lonni Hanson, MD [facility]  Findings Coronary Findings Diagnostic  Dominance: Right  Left Main Vessel is large. Vessel is angiographically normal.  Left Anterior Descending Vessel is moderate in size. The vessel is mildly tortuous. Prox LAD lesion is 50% stenosed. The lesion is focal and eccentric. Pressure gradient was performed on the lesion. Virtual FFR: 0.85.  First Diagonal Branch Vessel is small in size.  Second Product Manager  Vessel is moderate in size.  Left Circumflex Vessel is moderate in size. Vessel is angiographically normal. The vessel  is severely tortuous.  First Obtuse Marginal Branch Vessel is small in size.  Second Obtuse Marginal Branch Vessel is small in size.  Third Obtuse Marginal Branch Vessel is small in size.  Right Coronary Artery Vessel is moderate in size. Anterior take-off. Prox RCA lesion is 10% stenosed.  Right Posterior Descending Artery Vessel is moderate in size.  Right Posterior Atrioventricular Artery Vessel is moderate in size.  First Right Posterolateral Branch Vessel is small in size.  Second Right Posterolateral Branch Vessel is small in size.  Third Right Posterolateral Branch Vessel is small in size.  Intervention  No interventions have been documented.  Diagnostic Dominance: Right       ECHOCARDIOGRAM  ECHOCARDIOGRAM COMPLETE 10/05/2024  Narrative ECHOCARDIOGRAM REPORT    Patient Name:   Lindsey Cooper Date of Exam: 10/05/2024 Medical Rec #:  [id number]     Height:       64.5 in Accession #:    [id number]    Weight:       137.0 lb Date of Birth:  11-Jun-1956    BSA:          1.675 m Patient Age:    67 years      BP:           119/71 mmHg Patient Gender: F             HR:           63 bpm. Exam Location:  Inpatient  Procedure: 2D Echo, 3D Echo, Cardiac Doppler and Color Doppler (Both Spectral and Color Flow Doppler were utilized during procedure).  Indications:    Chest Pain R07.9  History:        Patient has no prior history of Echocardiogram examinations. Elevated troponin, hx of thoracic AA, Signs/Symptoms:Chest Pain; Risk Factors:Dyslipidemia.  Sonographer:    Koleen Popper RDCS Referring Phys: [id number] VISHAL R PATEL  IMPRESSIONS   1. Left ventricular ejection fraction, by estimation, is 45 to 50%. The left ventricle has mildly decreased function. The left ventricle demonstrates regional wall motion abnormalities (see scoring diagram/findings for description). The left ventricular internal cavity size was mildly dilated. There is mild  concentric left ventricular hypertrophy. Left ventricular diastolic parameters were normal. 2. Right ventricular systolic function is normal. The right ventricular size is normal. There is normal pulmonary artery systolic pressure. 3. A small pericardial effusion is present. The pericardial effusion is anterior to the right ventricle. There is no evidence of cardiac tamponade. 4. The mitral valve is degenerative. Trivial mitral valve regurgitation. No evidence of mitral stenosis. 5. The tricuspid valve is degenerative. 6. The aortic valve is tricuspid. Aortic valve regurgitation is moderate. Aortic valve sclerosis is present, with no evidence of aortic valve stenosis. 7. Aortic dilatation noted. There is moderate dilatation of the ascending aorta, measuring 44 mm. 8. The inferior vena cava is normal in size with greater than 50% respiratory variability, suggesting right atrial pressure of 3 mmHg.  Comparison(s): No prior Echocardiogram.  FINDINGS Left Ventricle: Left ventricular ejection fraction, by estimation, is 45 to 50%. The left ventricle has mildly decreased function. The left ventricle demonstrates regional wall motion abnormalities. 3D ejection fraction reviewed and evaluated as part of the interpretation. Alternate measurement of EF is felt to be most reflective of LV function. The left ventricular internal cavity size was mildly dilated. There is mild concentric left ventricular hypertrophy. Left  ventricular diastolic parameters were normal.   LV Wall Scoring: The anterior septum is akinetic. The inferior septum and entire inferior wall are hypokinetic.  Right Ventricle: The right ventricular size is normal. No increase in right ventricular wall thickness. Right ventricular systolic function is normal. There is normal pulmonary artery systolic pressure. The tricuspid regurgitant velocity is 2.28 m/s, and with an assumed right atrial pressure of 3 mmHg, the estimated right ventricular  systolic pressure is 23.8 mmHg.  Left Atrium: Left atrial size was normal in size.  Right Atrium: Right atrial size was not assessed.  Pericardium: A small pericardial effusion is present. The pericardial effusion is anterior to the right ventricle. There is no evidence of cardiac tamponade.  Mitral Valve: The mitral valve is degenerative in appearance. There is moderate thickening of the mitral valve leaflet(s). Trivial mitral valve regurgitation. No evidence of mitral valve stenosis.  Tricuspid Valve: The tricuspid valve is degenerative in appearance. Tricuspid valve regurgitation is mild . No evidence of tricuspid stenosis.  Aortic Valve: The aortic valve is tricuspid. Aortic valve regurgitation is moderate. PHT 430 ms. Aortic valve sclerosis is present, with no evidence of aortic valve stenosis.  Pulmonic Valve: The pulmonic valve was not well visualized. Pulmonic valve regurgitation is not visualized. No evidence of pulmonic stenosis.  Aorta: The aortic root is normal in size and structure and aortic dilatation noted. There is moderate dilatation of the ascending aorta, measuring 44 mm.  Venous: The inferior vena cava is normal in size with greater than 50% respiratory variability, suggesting right atrial pressure of 3 mmHg.  IAS/Shunts: No atrial level shunt detected by color flow Doppler.  Additional Comments: 3D was performed not requiring image post processing on an independent workstation and was normal.   LEFT VENTRICLE PLAX 2D LVIDd:         4.90 cm LVIDs:         2.70 cm LV PW:         1.10 cm LV IVS:        1.10 cm 3D Volume EF: 3D EF:        64 % LV Volumes (MOD) LV vol d, MOD A4C: 153.0 ml LV vol s, MOD A4C: 58.9 ml LV SV MOD A4C:     153.0 ml  RIGHT VENTRICLE TAPSE (M-mode): 1.5 cm  LEFT ATRIUM           Index LA diam:      3.83 cm 2.29 cm/m LA Vol (A2C): 39.7 ml 23.71 ml/m LA Vol (A4C): 32.2 ml 19.23 ml/m AORTIC VALVE LVOT Vmax:         104.00  cm/s LVOT Vmean:        66.800 cm/s LVOT VTI:          0.210 m AR Vena Contracta: 0.40 cm  MITRAL VALVE              TRICUSPID VALVE MV Area (PHT): 3.99 cm   TR Peak grad:   20.8 mmHg MV Decel Time: 190 msec   TR Vmax:        228.00 cm/s MR Peak grad: 69.9 mmHg MR Vmax:      418.00 cm/s SHUNTS Systemic VTI: 0.21 m  Emeline Calender Electronically signed by Emeline Calender Signature Date/Time: 10/05/2024/3:56:13 PM    Final          ______________________________________________________________________________________________      Risk Assessment/Calculations           Physical Exam VS:  BP 122/74 (  BP Location: Left Arm, Patient Position: Sitting, Cuff Size: Normal)   Pulse 65   Ht 5' 4 (1.626 m)   Wt 137 lb 6.4 oz (62.3 kg)   SpO2 97%   BMI 23.58 kg/m        Wt Readings from Last 3 Encounters:  10/22/24 137 lb 6.4 oz (62.3 kg)  10/04/24 137 lb (62.1 kg)  09/20/23 135 lb 9.6 oz (61.5 kg)    GEN: Well nourished, well developed in no acute distress NECK: No JVD; No carotid bruits CARDIAC: RRR, no murmurs, rubs, gallops RESPIRATORY:  Clear to auscultation without rales, wheezing or rhonchi  ABDOMEN: Soft, non-tender, non-distended EXTREMITIES:  No edema; No deformity   ASSESSMENT AND PLAN  CAD: Single-vessel nonobstructive CAD with 50% proximal LAD lesion on recent cardiac catheterization 10/08/2024.  Focus on medical therapy with lipid control.  Continue aspirin  and statin.  Will switch metoprolol  tartrate to metoprolol  succinate 25 mg daily.  Abnormal echocardiogram: Recent echo showed wall motion abnormality with EF 45 to 50%, this led to subsequent cardiac catheterization that showed nonobstructive disease.  Will switch metoprolol  tartrate to metoprolol  succinate given mild LV dysfunction  Hyperlipidemia: New LDL goal less than 70.  On atorvastatin  40 mg daily.  Will need a fasting lipid panel and LFT in 4 weeks, patient preferred to have the repeat blood work  ordered by PCP.  Duodenitis: Seen on recent CT image.  Symptom resolved.  Patient should complete the current bottle of Protonix  and may stop after that.  Thoracic aortic aneurysm: Recent CTA of the chest and abdomen showed 4.5 cm thoracic aortic aneurysm.  Followed by PCP on an annual basis.  If the size of thoracic aortic aneurysm increased to 4.6 cm or greater, will need to be followed with every 40-month with imaging.  Once thoracic aortic aneurysm reaches 5.0 cm, will need to be referred to cardiothoracic surgery to follow-up long-term in anticipation of potential surgery once TAA reaches 5.5 cm       Dispo: Follow-up in 28-month  Signed, Onesti Bonfiglio, [place]

## 2024-12-06 ENCOUNTER — Ambulatory Visit

## 2025-01-07 ENCOUNTER — Ambulatory Visit

## 2025-03-19 ENCOUNTER — Encounter: Admitting: Plastic Surgery
